# Patient Record
Sex: Female | Born: 1970 | Race: White | Hispanic: No | Marital: Single | Smoking: Current every day smoker
Health system: Southern US, Community
[De-identification: ages and names within clinical notes are randomized; demographics above are authoritative.]

## PROBLEM LIST (undated history)

## (undated) DIAGNOSIS — R519 Headache, unspecified: Secondary | ICD-10-CM

## (undated) DIAGNOSIS — K449 Diaphragmatic hernia without obstruction or gangrene: Secondary | ICD-10-CM

## (undated) DIAGNOSIS — C801 Malignant (primary) neoplasm, unspecified: Secondary | ICD-10-CM

## (undated) DIAGNOSIS — K219 Gastro-esophageal reflux disease without esophagitis: Secondary | ICD-10-CM

## (undated) DIAGNOSIS — F32A Depression, unspecified: Secondary | ICD-10-CM

## (undated) DIAGNOSIS — K589 Irritable bowel syndrome without diarrhea: Secondary | ICD-10-CM

## (undated) HISTORY — PX: ABDOMINAL HYSTERECTOMY: SHX81

## (undated) HISTORY — PX: TONSILLECTOMY: SUR1361

## (undated) HISTORY — PX: CHOLECYSTECTOMY: SHX55

## (undated) HISTORY — PX: BREAST BIOPSY: SHX20

---

## 2006-10-29 ENCOUNTER — Emergency Department: Payer: Self-pay | Admitting: Emergency Medicine

## 2006-10-29 ENCOUNTER — Other Ambulatory Visit: Payer: Self-pay

## 2006-12-28 ENCOUNTER — Ambulatory Visit: Payer: Self-pay | Admitting: Obstetrics and Gynecology

## 2007-01-11 ENCOUNTER — Ambulatory Visit: Payer: Self-pay | Admitting: Obstetrics and Gynecology

## 2007-03-15 ENCOUNTER — Ambulatory Visit: Payer: Self-pay

## 2007-04-02 ENCOUNTER — Ambulatory Visit: Payer: Self-pay | Admitting: Gastroenterology

## 2007-08-07 ENCOUNTER — Ambulatory Visit: Payer: Self-pay | Admitting: Family Medicine

## 2008-04-07 ENCOUNTER — Ambulatory Visit: Payer: Self-pay

## 2008-09-02 ENCOUNTER — Ambulatory Visit: Payer: Self-pay | Admitting: Nurse Practitioner

## 2008-12-01 ENCOUNTER — Ambulatory Visit: Payer: Self-pay | Admitting: Unknown Physician Specialty

## 2008-12-08 ENCOUNTER — Ambulatory Visit: Payer: Self-pay | Admitting: Unknown Physician Specialty

## 2009-01-08 ENCOUNTER — Ambulatory Visit: Payer: Self-pay | Admitting: Unknown Physician Specialty

## 2009-01-12 ENCOUNTER — Ambulatory Visit: Payer: Self-pay | Admitting: Unknown Physician Specialty

## 2009-03-26 ENCOUNTER — Emergency Department: Payer: Self-pay | Admitting: Emergency Medicine

## 2009-06-18 ENCOUNTER — Ambulatory Visit: Payer: Self-pay | Admitting: Gastroenterology

## 2010-04-06 ENCOUNTER — Ambulatory Visit: Payer: Self-pay | Admitting: Obstetrics and Gynecology

## 2010-04-11 ENCOUNTER — Ambulatory Visit: Payer: Self-pay | Admitting: Obstetrics and Gynecology

## 2010-04-13 LAB — PATHOLOGY REPORT

## 2010-06-07 ENCOUNTER — Emergency Department: Payer: Self-pay | Admitting: Emergency Medicine

## 2011-07-19 ENCOUNTER — Ambulatory Visit: Payer: Self-pay | Admitting: Family Medicine

## 2011-10-16 ENCOUNTER — Emergency Department: Payer: Self-pay | Admitting: Emergency Medicine

## 2013-02-14 ENCOUNTER — Ambulatory Visit: Payer: Self-pay | Admitting: Physician Assistant

## 2013-03-19 ENCOUNTER — Ambulatory Visit: Payer: Self-pay | Admitting: Family Medicine

## 2013-03-19 LAB — RAPID INFLUENZA A&B ANTIGENS

## 2013-06-21 ENCOUNTER — Ambulatory Visit: Payer: Self-pay | Admitting: Family Medicine

## 2013-06-21 LAB — BASIC METABOLIC PANEL
ANION GAP: 10 (ref 7–16)
BUN: 11 mg/dL (ref 7–18)
CALCIUM: 8.7 mg/dL (ref 8.5–10.1)
CHLORIDE: 104 mmol/L (ref 98–107)
CREATININE: 0.77 mg/dL (ref 0.60–1.30)
Co2: 24 mmol/L (ref 21–32)
EGFR (African American): >60
Glucose: 94 mg/dL (ref 65–99)
Osmolality: 275 (ref 275–301)
POTASSIUM: 4 mmol/L (ref 3.5–5.1)
Sodium: 138 mmol/L (ref 136–145)

## 2013-06-21 LAB — CBC WITH DIFFERENTIAL/PLATELET
Basophil #: 0.1 10*3/uL (ref 0.0–0.1)
Basophil %: 0.8 %
Eosinophil #: 0.2 10*3/uL (ref 0.0–0.7)
Eosinophil %: 2.6 %
HCT: 45 % (ref 35.0–47.0)
HGB: 14.8 g/dL (ref 12.0–16.0)
LYMPHS ABS: 2.3 10*3/uL (ref 1.0–3.6)
Lymphocyte %: 26.3 %
MCH: 30.3 pg (ref 26.0–34.0)
MCHC: 33 g/dL (ref 32.0–36.0)
MCV: 92 fL (ref 80–100)
Monocyte #: 0.4 x10 3/mm (ref 0.2–0.9)
Monocyte %: 4.7 %
Neutrophil #: 5.8 10*3/uL (ref 1.4–6.5)
Neutrophil %: 65.6 %
PLATELETS: 322 10*3/uL (ref 150–440)
RBC: 4.9 10*6/uL (ref 3.80–5.20)
RDW: 14.1 % (ref 11.5–14.5)
WBC: 8.9 10*3/uL (ref 3.6–11.0)

## 2013-06-21 LAB — URINALYSIS, COMPLETE
BACTERIA: NEGATIVE
Blood: NEGATIVE
Glucose,UR: NEGATIVE mg/dL (ref 0–75)
KETONE: NEGATIVE
Leukocyte Esterase: NEGATIVE
NITRITE: NEGATIVE
PH: 6 (ref 4.5–8.0)
PROTEIN: NEGATIVE
Specific Gravity: 1.02 (ref 1.003–1.030)

## 2013-06-21 LAB — PREGNANCY, URINE: PREGNANCY TEST, URINE: NEGATIVE m[IU]/mL

## 2013-08-24 ENCOUNTER — Ambulatory Visit: Payer: Self-pay | Admitting: Physician Assistant

## 2013-10-07 ENCOUNTER — Ambulatory Visit: Payer: Self-pay | Admitting: Gastroenterology

## 2013-10-09 LAB — PATHOLOGY REPORT

## 2013-12-19 ENCOUNTER — Ambulatory Visit: Payer: Self-pay

## 2014-01-20 ENCOUNTER — Ambulatory Visit: Payer: Self-pay | Admitting: Gastroenterology

## 2014-02-06 HISTORY — PX: BREAST CYST ASPIRATION: SHX578

## 2014-04-11 ENCOUNTER — Ambulatory Visit: Payer: Self-pay | Admitting: Physician Assistant

## 2014-04-22 ENCOUNTER — Ambulatory Visit: Payer: Self-pay | Admitting: Physician Assistant

## 2014-05-28 ENCOUNTER — Ambulatory Visit
Admit: 2014-05-28 | Disposition: A | Discharge status: Home / Self Care | Payer: Self-pay | Attending: Family Medicine | Admitting: Family Medicine

## 2014-05-28 LAB — URINALYSIS, COMPLETE
Bilirubin,UR: NEGATIVE
GLUCOSE, UR: NEGATIVE
Ketone: NEGATIVE
Leukocyte Esterase: NEGATIVE
NITRITE: NEGATIVE
PH: 5.5 (ref 5.0–8.0)
PROTEIN: NEGATIVE
SPECIFIC GRAVITY: 1.02 (ref 1.000–1.030)

## 2014-06-01 LAB — SURGICAL PATHOLOGY

## 2016-02-04 ENCOUNTER — Ambulatory Visit
Admission: EM | Admit: 2016-02-04 | Discharge: 2016-02-04 | Disposition: A | Discharge status: Home / Self Care | Payer: BLUE CROSS/BLUE SHIELD | Attending: Family Medicine | Admitting: Family Medicine

## 2016-02-04 DIAGNOSIS — J0100 Acute maxillary sinusitis, unspecified: Secondary | ICD-10-CM | POA: Diagnosis not present

## 2016-02-04 DIAGNOSIS — J069 Acute upper respiratory infection, unspecified: Secondary | ICD-10-CM

## 2016-02-04 MED ORDER — FLUTICASONE PROPIONATE 50 MCG/ACT NA SUSP: 2.0000 | Freq: Every day | NASAL | 0 refills | Status: AC

## 2016-02-04 MED ORDER — FEXOFENADINE-PSEUDOEPHED ER 180-240 MG PO TB24: 1.0000 | ORAL_TABLET | Freq: Every day | ORAL | 0 refills | Status: DC

## 2016-02-04 MED ORDER — CEFUROXIME AXETIL 500 MG PO TABS: 500.0000 mg | ORAL_TABLET | Freq: Two times a day (BID) | ORAL | 0 refills | Status: DC

## 2016-02-04 MED ORDER — PREDNISONE 10 MG (21) PO TBPK: ORAL_TABLET | ORAL | 0 refills | Status: DC

## 2016-02-04 NOTE — ED Provider Notes (Signed)
[facility]    CSN: [id number] Arrival date & time: 02/04/16  1352     History   Chief Complaint Chief Complaint  Patient presents with  . Recurrent Sinusitis    HPI Patricia Hoffman is a 45 y.o. female.   She is here because of nasal congestion cough she states started before Thanksgiving. She was seen at one urgent care in [place] and placed on amoxicillin. She states didn't seem to knock it out and the symptoms remain and have gotten worse now. She states that when she normally has a sinus infection they use Augmentin and amoxicillin seem to have the kick to knock it out. She's had to use nasal steroid nasal spray and oral prednisone before in the past. Unfortunately she still smokes. She's had recurrent sinus infections past surgeries abdominal hysterectomy cholecystectomy and tonsillectomy. No known drug allergies. No chronic medical problems and no pertinent family medical history relevant to today's visit.   The history is provided by the patient. No language interpreter was used.  URI  Presenting symptoms: congestion, cough, facial pain and rhinorrhea   Presenting symptoms: no fever   Severity:  Moderate Duration:  6 weeks Timing:  Constant Progression:  Worsening Chronicity:  Recurrent Relieved by:  Nothing Ineffective treatments:  OTC medications and prescription medications Associated symptoms: sinus pain and swollen glands   Associated symptoms: no wheezing   Risk factors: no chronic cardiac disease, no chronic kidney disease, no diabetes mellitus, no recent illness, no recent travel and no sick contacts     History reviewed. No pertinent past medical history.  There are no active problems to display for this patient.   Past Surgical History:  Procedure Laterality Date  . ABDOMINAL HYSTERECTOMY    . CHOLECYSTECTOMY    . TONSILLECTOMY      OB History    No data available       Home Medications    Prior to Admission medications     Medication Sig Start Date End Date Taking? Authorizing Provider  buPROPion (WELLBUTRIN) 75 MG tablet Take 75 mg by mouth 2 (two) times daily.   Yes Historical Provider, MD  sucralfate (CARAFATE) 1 g tablet Take 1 g by mouth 4 (four) times daily -  with meals and at bedtime.   Yes Historical Provider, MD  cefUROXime (CEFTIN) 500 MG tablet Take 1 tablet (500 mg total) by mouth 2 (two) times daily. 02/04/16   Frederich Cha, MD  fexofenadine-pseudoephedrine (ALLEGRA-D ALLERGY & CONGESTION) 180-240 MG 24 hr tablet Take 1 tablet by mouth daily. 02/04/16   Frederich Cha, MD  fluticasone (FLONASE) 50 MCG/ACT nasal spray Place 2 sprays into both nostrils daily. 02/04/16   Frederich Cha, MD  predniSONE (STERAPRED UNI-PAK 21 TAB) 10 MG (21) TBPK tablet Sig 6 tablet day 1, 5 tablets day 2, 4 tablets day 3,,3tablets day 4, 2 tablets day 5, 1 tablet day 6 take all tablets orally 02/04/16   Frederich Cha, MD    Family History History reviewed. No pertinent family history.  Social History Social History  Substance Use Topics  . Smoking status: Never Smoker  . Smokeless tobacco: Never Used  . Alcohol use No     Allergies   Patient has no known allergies.   Review of Systems Review of Systems  Constitutional: Negative for activity change and fever.  HENT: Positive for congestion, facial swelling, rhinorrhea, sinus pain and sinus pressure.   Respiratory: Positive for cough. Negative for wheezing.   All other  systems reviewed and are negative.    Physical Exam Triage Vital Signs ED Triage Vitals  Enc Vitals Group     BP 02/04/16 1448 121/75     Pulse Rate 02/04/16 1448 66     Resp 02/04/16 1448 18     Temp 02/04/16 1448 97.8 F (36.6 C)     Temp Source 02/04/16 1448 Oral     SpO2 02/04/16 1448 100 %     Weight 02/04/16 1448 140 lb (63.5 kg)     Height 02/04/16 1448 5\' 6"  (1.676 m)     Head Circumference --      Peak Flow --      Pain Score 02/04/16 1450 5     Pain Loc --      Pain Edu? --       Excl. in [place]? --    No data found.   Updated Vital Signs BP 121/75 (BP Location: Left Arm)   Pulse 66   Temp 97.8 F (36.6 C) (Oral)   Resp 18   Ht 5\' 6"  (1.676 m)   Wt 140 lb (63.5 kg)   SpO2 100%   BMI 22.60 kg/m   Visual Acuity Right Eye Distance:   Left Eye Distance:   Bilateral Distance:    Right Eye Near:   Left Eye Near:    Bilateral Near:     Physical Exam  Constitutional: She is oriented to person, place, and time. She appears well-developed and well-nourished.  HENT:  Head: Normocephalic and atraumatic.  Right Ear: External ear normal.  Left Ear: External ear normal.  Eyes: EOM are normal. Pupils are equal, round, and reactive to light.  Neck: Normal range of motion. Neck supple. No tracheal deviation present. No thyromegaly present.  Cardiovascular: Normal rate and regular rhythm.   Pulmonary/Chest: Effort normal and breath sounds normal.  Musculoskeletal: Normal range of motion.  Lymphadenopathy:    She has cervical adenopathy.  Neurological: She is alert and oriented to person, place, and time.  Skin: Skin is warm.  Psychiatric: She has a normal mood and affect.  Vitals reviewed.    UC Treatments / Results  Labs (all labs ordered are listed, but only abnormal results are displayed) Labs Reviewed - No data to display  EKG  EKG Interpretation None       Radiology No results found.  Procedures Procedures (including critical care time)  Medications Ordered in UC Medications - No data to display   Initial Impression / Assessment and Plan / UC Course  I have reviewed the triage vital signs and the nursing notes.  Pertinent labs & imaging results that were available during my care of the patient were reviewed by me and considered in my medical decision making (see chart for details).  Clinical Course    Because of the exposure to amoxicillin earlier in the persistence of her systems place on Ceftin 500 mg 1 tablet twice a day  Flonase nasal spray as directed 2 puffs each nostril and Allegra-D 24 hours 1 tablet daily. Will add also 6 day course of prednisone taper. Strongly suggested she stop smoking pot with PCP in 1-2 weeks not better she declined work note at this time.   Final Clinical Impressions(s) / UC Diagnoses   Final diagnoses:  Acute maxillary sinusitis, recurrence not specified  Upper respiratory tract infection, unspecified type    New Prescriptions New Prescriptions   CEFUROXIME (CEFTIN) 500 MG TABLET    Take 1 tablet (500 mg total)  by mouth 2 (two) times daily.   FEXOFENADINE-PSEUDOEPHEDRINE (ALLEGRA-D ALLERGY & CONGESTION) 180-240 MG 24 HR TABLET    Take 1 tablet by mouth daily.   FLUTICASONE (FLONASE) 50 MCG/ACT NASAL SPRAY    Place 2 sprays into both nostrils daily.   PREDNISONE (STERAPRED UNI-PAK 21 TAB) 10 MG (21) TBPK TABLET    Sig 6 tablet day 1, 5 tablets day 2, 4 tablets day 3,,3tablets day 4, 2 tablets day 5, 1 tablet day 6 take all tablets orally     Note: This dictation was prepared with Dragon dictation along with smaller phrase technology. Any transcriptional errors that result from this process are unintentional.   Frederich Cha, MD 02/04/16 1606

## 2016-02-04 NOTE — ED Triage Notes (Signed)
Pt c/o of sinus infection for a over month. Facial pressure, headache, congestion and productive cough.

## 2016-03-29 ENCOUNTER — Other Ambulatory Visit: Payer: Self-pay | Admitting: Family Medicine

## 2016-03-29 DIAGNOSIS — Z1231 Encounter for screening mammogram for malignant neoplasm of breast: Secondary | ICD-10-CM

## 2016-04-12 ENCOUNTER — Ambulatory Visit
Admission: RE | Admit: 2016-04-12 | Discharge: 2016-04-12 | Disposition: A | Discharge status: Home / Self Care | Payer: BLUE CROSS/BLUE SHIELD | Source: Ambulatory Visit | Attending: Family Medicine | Admitting: Family Medicine

## 2016-04-12 DIAGNOSIS — Z1231 Encounter for screening mammogram for malignant neoplasm of breast: Secondary | ICD-10-CM | POA: Insufficient documentation

## 2016-04-18 ENCOUNTER — Other Ambulatory Visit: Payer: Self-pay | Admitting: *Deleted

## 2016-04-18 ENCOUNTER — Inpatient Hospital Stay
Admission: RE | Admit: 2016-04-18 | Discharge: 2016-04-18 | Disposition: A | Discharge status: Home / Self Care | Payer: Self-pay | Source: Ambulatory Visit | Attending: *Deleted | Admitting: *Deleted

## 2016-04-18 DIAGNOSIS — Z9289 Personal history of other medical treatment: Secondary | ICD-10-CM

## 2016-04-21 ENCOUNTER — Ambulatory Visit (INDEPENDENT_AMBULATORY_CARE_PROVIDER_SITE_OTHER): Payer: BLUE CROSS/BLUE SHIELD

## 2016-04-21 ENCOUNTER — Encounter: Payer: Self-pay | Admitting: Emergency Medicine

## 2016-04-21 ENCOUNTER — Ambulatory Visit
Admission: EM | Admit: 2016-04-21 | Discharge: 2016-04-21 | Disposition: A | Discharge status: Home / Self Care | Payer: BLUE CROSS/BLUE SHIELD | Attending: Family Medicine | Admitting: Family Medicine

## 2016-04-21 DIAGNOSIS — M25532 Pain in left wrist: Secondary | ICD-10-CM

## 2016-04-21 MED ORDER — NAPROXEN 500 MG PO TABS: 500.0000 mg | ORAL_TABLET | Freq: Two times a day (BID) | ORAL | 0 refills | Status: DC

## 2016-04-21 NOTE — ED Provider Notes (Signed)
CSN: [id number]     Arrival date & time 04/21/16  5102 History   First MD Initiated Contact with Patient 04/21/16 [phone]     Chief Complaint  Patient presents with  . 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 She doesn't not remember any specific injury. 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 Currently she is most tender over the distal ulna and ulnar styloid as well as the pisiform  No past medical history on file. Past Surgical History:  Procedure Laterality Date  . ABDOMINAL HYSTERECTOMY    . BREAST CYST ASPIRATION Left 2016   left fna-neg  . CHOLECYSTECTOMY    . TONSILLECTOMY     Family History  Problem Relation Age of Onset  . Breast cancer Neg Hx    Social History  Substance Use Topics  . Smoking status: Current Every Day Smoker    Packs/day: 0.50    Types: Cigarettes  . Smokeless tobacco: Never Used  . Alcohol use No   OB History    No data available     Review of Systems  Constitutional: Positive for activity change. Negative for chills, fatigue and fever.  Musculoskeletal: Positive for arthralgias, joint swelling and myalgias.  All other systems reviewed and are negative.   Allergies  Patient has no known allergies.  Home Medications   Prior to Admission medications   Medication Sig Start Date End Date Taking? Authorizing Provider  buPROPion (WELLBUTRIN) 75 MG tablet Take 75 mg by mouth 2 (two) times daily.    Historical Provider, MD  cefUROXime (CEFTIN) 500 MG tablet Take 1 tablet (500 mg total) by mouth 2 (two) times daily. 02/04/16   Frederich Cha, MD  fexofenadine-pseudoephedrine (ALLEGRA-D ALLERGY & CONGESTION) 180-240 MG 24 hr tablet Take 1 tablet by mouth daily. 02/04/16   Frederich Cha, MD  fluticasone (FLONASE) 50 MCG/ACT nasal spray Place 2 sprays into both nostrils daily. 02/04/16   Frederich Cha, MD  naproxen (NAPROSYN) 500 MG tablet Take 1 tablet (500 mg total) by mouth 2 (two) times daily with a meal. 04/21/16   Lorin Picket, PA-C  predniSONE (STERAPRED UNI-PAK 21 TAB) 10 MG (21) TBPK tablet Sig 6 tablet day 1, 5 tablets day 2, 4 tablets day 3,,3tablets day 4, 2 tablets day 5, 1 tablet day 6 take all tablets orally 02/04/16   Frederich Cha, MD  sucralfate (CARAFATE) 1 g tablet Take 1 g by mouth 4 (four) times daily -  with meals and at bedtime.    Historical Provider, MD   Meds Ordered and Administered this Visit  Medications - No data to display  BP 124/80 (BP Location: Left Arm)   Pulse 65   Temp 97.7 F (36.5 C) (Oral)   Resp 16   Ht 5\' 5"  (1.651 m)   Wt 155 lb (70.3 kg)   SpO2 100%   BMI 25.79 kg/m  No data found.   Physical Exam  Constitutional: She is oriented to person, place, and time. She appears well-developed and well-nourished. No distress.  HENT:  Head: Normocephalic and atraumatic.  Eyes: Pupils are equal, round, and reactive to light.  Neck: Normal range of motion.  Musculoskeletal: She exhibits edema and tenderness.  Examination of the left nondominant wrist shows pronation  supination slightly limited because of discomfort over the ulna. Extension is limited to approximately 45 flexion to 60. Pronation lacks approximately 10 and supination the same. Rectal tenderness is over the distal ulna particularly the styloid area and also in the possible form. Assisted ulnar deviation is the most painful motion. Incision is intact distally and pulses are intact both the ulnar and radial  Neurological: She is alert and oriented to person, place, and time.  Skin: Skin is dry. She is not diaphoretic.  Psychiatric: She has a normal mood and affect. Her behavior is normal. Judgment and thought content normal.  Nursing note and vitals  reviewed.   Urgent Care Course     Procedures (including critical care time)  Labs Review Labs Reviewed - No data to display  Imaging Review Dg Wrist Complete Left  Result Date: 04/21/2016 CLINICAL DATA:  Pain along medial wrist x2 days EXAM: LEFT WRIST - COMPLETE 3+ VIEW COMPARISON:  None. FINDINGS: No fracture or dislocation is seen. The joint spaces are preserved. Visualized soft tissues are within normal limits. IMPRESSION: Negative. Electronically Signed   By: Julian Hy M.D.   On: 04/21/2016 09:43     Visual Acuity Review  Right Eye Distance:   Left Eye Distance:   Bilateral Distance:    Right Eye Near:   Left Eye Near:    Bilateral Near:         MDM   1. Acute pain of left wrist    New Prescriptions   NAPROXEN (NAPROSYN) 500 MG TABLET    Take 1 tablet (500 mg total) by mouth 2 (two) times daily with a meal.  Plan: 1. Test/x-ray results and diagnosis reviewed with patient 2. rx as per orders; risks, benefits, potential side effects reviewed with patient 3. Recommend supportive treatment with Symptom avoidance and rest as necessary. Should avoid repetitive motion for the next to 2 weeks. If she is not improving she should follow-up with a orthopedic surgeon. 4. F/u prn if symptoms worsen or don't improve     Lorin Picket, PA-C 04/21/16 1006

## 2016-04-21 NOTE — ED Triage Notes (Signed)
Pt reports while working yesterday had sudden onset of left wrist pain. 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

## 2016-10-21 ENCOUNTER — Ambulatory Visit
Admission: EM | Admit: 2016-10-21 | Discharge: 2016-10-21 | Disposition: A | Discharge status: Home / Self Care | Payer: BLUE CROSS/BLUE SHIELD | Attending: Family Medicine | Admitting: Family Medicine

## 2016-10-21 DIAGNOSIS — R300 Dysuria: Secondary | ICD-10-CM

## 2016-10-21 DIAGNOSIS — S39012A Strain of muscle, fascia and tendon of lower back, initial encounter: Secondary | ICD-10-CM

## 2016-10-21 LAB — URINALYSIS, COMPLETE (UACMP) WITH MICROSCOPIC
Bilirubin Urine: NEGATIVE
Glucose, UA: NEGATIVE mg/dL
Ketones, ur: NEGATIVE mg/dL
Leukocytes, UA: NEGATIVE
Nitrite: NEGATIVE
PH: 6 (ref 5.0–8.0)
Protein, ur: NEGATIVE mg/dL
SPECIFIC GRAVITY, URINE: 1.01 (ref 1.005–1.030)

## 2016-10-21 MED ORDER — CYCLOBENZAPRINE HCL 10 MG PO TABS: 10.0000 mg | ORAL_TABLET | Freq: Two times a day (BID) | ORAL | 0 refills | Status: DC | PRN

## 2016-10-21 MED ORDER — CEPHALEXIN 500 MG PO CAPS: 500.0000 mg | ORAL_CAPSULE | Freq: Two times a day (BID) | ORAL | 0 refills | Status: DC

## 2016-10-21 MED ORDER — CEPHALEXIN 500 MG PO CAPS: 500.0000 mg | ORAL_CAPSULE | Freq: Two times a day (BID) | ORAL | 0 refills | Status: AC

## 2016-10-21 NOTE — Discharge Instructions (Signed)
Take medication as prescribed. Rest. Drink plenty of fluids.  ° °Follow up with your primary care physician this week as needed. Return to Urgent care for new or worsening concerns.  ° °

## 2016-10-21 NOTE — ED Provider Notes (Signed)
[facility] ____________________________________________  Time seen: Approximately 10:33 AM  I have reviewed the triage vital signs and the nursing notes.   HISTORY  Chief Complaint Back Pain (lower) and Urinary Frequency   HPI Patricia Hoffman is a 46 y.o. female  presenting for evaluation of 3 days of urinary frequency, urinary urgency and some low back pain. States no burning with urination, fevers or abdominal pain. Some suprapubic pressure. Patient states she is unsure if this is urinary or back related, states that she does have to do a lot of pushing of heavy objects at work. 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 States low back pain does improve with rest and is aggravated by twisting and bending. States occasionally she has some pain radiation down left leg, not constant. No paresthesias or decreased range of motion. Denies any fall, direct injury or direct trauma. Denies abrupt onset of back pain. Denies chronic back pain. Reports occasional UTIs in past, not recurrent. Denies known trigger. Reports continues to eat and drink well. Has not been taking anything over-the-counter for the same complaints. States that she has IBS and over-the-counter ibuprofen often flares up her IBS. Denies other aggravating or alleviating factors. Reports continues to eat and drink well. States last worked yesterday. Denies urinary incontinence or retention.  Denies chest pain, shortness of breath, abdominal pain, dysuria, extremity pain, extremity swelling or rash. Denies recent sickness. Denies recent antibiotic use.   No LMP recorded. Patient has had a hysterectomy.   medical history Depression Migraines  There are no active problems to display for this patient.   Past Surgical History:  Procedure Laterality Date  . ABDOMINAL HYSTERECTOMY    . BREAST CYST ASPIRATION Left 2016   left fna-neg  . CHOLECYSTECTOMY    . TONSILLECTOMY       No current  facility-administered medications for this encounter.   Current Outpatient Prescriptions:  .  amitriptyline (ELAVIL) 50 MG tablet, Take 50 mg by mouth at bedtime., Disp: , Rfl:  .  fexofenadine-pseudoephedrine (ALLEGRA-D ALLERGY & CONGESTION) 180-240 MG 24 hr tablet, Take 1 tablet by mouth daily., Disp: 30 tablet, Rfl: 0 .  FLUoxetine (PROZAC) 40 MG capsule, Take 40 mg by mouth daily., Disp: , Rfl:  .  fluticasone (FLONASE) 50 MCG/ACT nasal spray, Place 2 sprays into both nostrils daily., Disp: 16 g, Rfl: 0 .  buPROPion (WELLBUTRIN) 75 MG tablet, Take 75 mg by mouth 2 (two) times daily., Disp: , Rfl:  .  cefUROXime (CEFTIN) 500 MG tablet, Take 1 tablet (500 mg total) by mouth 2 (two) times daily., Disp: 20 tablet, Rfl: 0 .  cephALEXin (KEFLEX) 500 MG capsule, Take 1 capsule (500 mg total) by mouth 2 (two) times daily., Disp: 14 capsule, Rfl: 0 .  cyclobenzaprine (FLEXERIL) 10 MG tablet, Take 1 tablet (10 mg total) by mouth 2 (two) times daily as needed for muscle spasms. Do not drive while taking as can cause drowsiness, Disp: 15 tablet, Rfl: 0 .  naproxen (NAPROSYN) 500 MG tablet, Take 1 tablet (500 mg total) by mouth 2 (two) times daily with a meal., Disp: 60 tablet, Rfl: 0 .  predniSONE (STERAPRED UNI-PAK 21 TAB) 10 MG (21) TBPK tablet, Sig 6 tablet day 1, 5 tablets day 2, 4 tablets day 3,,3tablets day 4, 2 tablets day 5, 1 tablet day 6 take all tablets orally, Disp: 21 tablet, Rfl: 0 .  sucralfate (CARAFATE) 1 g tablet, Take 1 g by mouth 4 (four) times  daily -  with meals and at bedtime., Disp: , Rfl:   Allergies Patient has no known allergies.  Family History  Problem Relation Age of Onset  . Breast cancer Neg Hx     Social History Social History  Substance Use Topics  . Smoking status: Current Every Day Smoker    Packs/day: 1.00    Types: Cigarettes  . Smokeless tobacco: Never Used  . Alcohol use No    Review of Systems Constitutional: No fever/chills ENT: No sore  throat. Cardiovascular: Denies chest pain. Respiratory: Denies shortness of breath. Gastrointestinal: No abdominal pain.  No nausea, no vomiting.  No diarrhea.  No constipation. Genitourinary: Positive for dysuria. Musculoskeletal: Positive for back pain. Skin: Negative for rash.   ____________________________________________   PHYSICAL EXAM:  VITAL SIGNS: ED Triage Vitals  Enc Vitals Group     BP 10/21/16 0940 127/78     Pulse Rate 10/21/16 0940 73     Resp 10/21/16 0940 18     Temp 10/21/16 0940 98.1 F (36.7 C)     Temp Source 10/21/16 0940 Oral     SpO2 10/21/16 0940 100 %     Weight 10/21/16 0938 150 lb (68 kg)     Height 10/21/16 0938 5\' 6"  (1.676 m)     Head Circumference --      Peak Flow --      Pain Score 10/21/16 0938 6     Pain Loc --      Pain Edu? --      Excl. in [place]? --     Constitutional: Alert and oriented. Well appearing and in no acute distress. Cardiovascular: Normal rate, regular rhythm. Grossly normal heart sounds.  Good peripheral circulation. Respiratory: Normal respiratory effort without tachypnea nor retractions. Breath sounds are clear and equal bilaterally. No wheezes, rales, rhonchi. Gastrointestinal: Soft and nontender. No distention. Normal Bowel sounds. No CVA tenderness. Musculoskeletal: No midline cervical, thoracic or lumbar tenderness to palpation. Bilateral pedal pulses equal and easily palpated.      Right lower leg:  No tenderness or edema.      Left lower leg:  No tenderness or edema.  Except: Mild bilateral paralumbar tenderness to palpation, no point midline tenderness, no swelling or ecchymosis, pain increases with lumbar flexion and extension as well as lumbar right and left rotation, no pain with standing bilateral knee lifts, bilateral plantar flexion and dorsiflexion strong and equal, changes positions quickly and room a steady gait. Neurologic:  Normal speech and language. No gross focal neurologic deficits are appreciated.  Speech is normal. No gait instability.  Skin:  Skin is warm, dry and intact. No rash noted. Psychiatric: Mood and affect are normal. Speech and behavior are normal. Patient exhibits appropriate insight and judgment   ___________________________________________   LABS (all labs ordered are listed, but only abnormal results are displayed)  Labs Reviewed  URINALYSIS, COMPLETE (UACMP) WITH MICROSCOPIC - Abnormal; Notable for the following:       Result Value   Color, Urine STRAW (*)    Hgb urine dipstick TRACE (*)    Squamous Epithelial / LPF 0-5 (*)    Bacteria, UA FEW (*)    All other components within normal limits  URINE CULTURE     PROCEDURES Procedures     INITIAL IMPRESSION / ASSESSMENT AND PLAN / ED COURSE  Pertinent labs & imaging results that were available during my care of the patient were reviewed by me and considered in my medical decision making (  see chart for details).  Well appearing patient. No acute distress. Suspect lumbar strain as well as concern for urinary tract infection, will culture urine. Will start patient on oral Keflex. Patient does not tolerate NSAIDs well due to IBS per patient, will treat with when necessary Flexeril. Encourage rest, fluids and supportive care. Discussed follow-up as needed for continued back pain.Discussed indication, risks and benefits of medications with patient.  Discussed follow up with Primary care physician this week. Discussed follow up and return parameters including no resolution or any worsening concerns. Patient verbalized understanding and agreed to plan.   ____________________________________________   FINAL CLINICAL IMPRESSION(S) / ED DIAGNOSES  Final diagnoses:  Dysuria  Low back strain, initial encounter     Discharge Medication List as of 10/21/2016 10:19 AM      Note: This dictation was prepared with Dragon dictation along with smaller phrase technology. Any transcriptional errors that result from this  process are unintentional.         Marylene Land, NP 10/21/16 1107

## 2016-10-21 NOTE — ED Triage Notes (Signed)
Patient complains of low back pain and urinary frequency that started around 3 days ago. Patient states that she has noticed some urgency as well.

## 2016-10-22 LAB — URINE CULTURE

## 2016-11-01 ENCOUNTER — Encounter: Payer: Self-pay | Admitting: *Deleted

## 2016-11-01 ENCOUNTER — Ambulatory Visit
Admission: EM | Admit: 2016-11-01 | Discharge: 2016-11-01 | Disposition: A | Discharge status: Home / Self Care | Payer: BLUE CROSS/BLUE SHIELD | Attending: Family Medicine | Admitting: Family Medicine

## 2016-11-01 DIAGNOSIS — H9201 Otalgia, right ear: Secondary | ICD-10-CM | POA: Diagnosis not present

## 2016-11-01 DIAGNOSIS — J0100 Acute maxillary sinusitis, unspecified: Secondary | ICD-10-CM | POA: Diagnosis not present

## 2016-11-01 HISTORY — DX: Malignant (primary) neoplasm, unspecified: C80.1

## 2016-11-01 MED ORDER — AMOXICILLIN 875 MG PO TABS: 875.0000 mg | ORAL_TABLET | Freq: Two times a day (BID) | ORAL | 0 refills | Status: DC

## 2016-11-01 NOTE — ED Triage Notes (Signed)
Right ear pain, facial pain, head congestion, productive cough- green, x3-4 days.

## 2016-11-01 NOTE — ED Provider Notes (Signed)
[facility]    CSN: [id number] Arrival date & time: 11/01/16  3762     History   Chief Complaint Chief Complaint  Patient presents with  . Otalgia  . Cough  . Facial Pain    HPI Patricia Hoffman is a 46 y.o. female.   The history is provided by the patient.  Otalgia  Associated symptoms: congestion and cough   Associated symptoms: no headaches and no neck pain   Cough  Associated symptoms: ear pain   Associated symptoms: no headaches, no myalgias and no wheezing   URI  Presenting symptoms: congestion, cough, ear pain and facial pain   Severity:  Moderate Onset quality:  Sudden Duration:  6 days Timing:  Constant Progression:  Worsening Chronicity:  New Relieved by:  None tried Worsened by:  Nothing Ineffective treatments:  None tried Associated symptoms: sinus pain   Associated symptoms: no arthralgias, no headaches, no myalgias, no neck pain, no sneezing, no swollen glands and no wheezing   Risk factors: not elderly, no chronic cardiac disease, no chronic kidney disease, no chronic respiratory disease (none but chronic smoker), no diabetes mellitus, no immunosuppression, no recent illness, no recent travel and no sick contacts     Past Medical History:  Diagnosis Date  . Cancer ([place])     There are no active problems to display for this patient.   Past Surgical History:  Procedure Laterality Date  . ABDOMINAL HYSTERECTOMY    . BREAST CYST ASPIRATION Left 2016   left fna-neg  . CHOLECYSTECTOMY    . TONSILLECTOMY      OB History    No data available       Home Medications    Prior to Admission medications   Medication Sig Start Date End Date Taking? Authorizing Provider  amitriptyline (ELAVIL) 50 MG tablet Take 50 mg by mouth at bedtime.   Yes [provider]  fexofenadine-pseudoephedrine (ALLEGRA-D ALLERGY & CONGESTION) 180-240 MG 24 hr tablet Take 1 tablet by mouth daily. 02/04/16  Yes Frederich Cha, MD  FLUoxetine  (PROZAC) 40 MG capsule Take 40 mg by mouth daily.   Yes [provider]  fluticasone (FLONASE) 50 MCG/ACT nasal spray Place 2 sprays into both nostrils daily. 02/04/16  Yes Frederich Cha, MD  amoxicillin (AMOXIL) 875 MG tablet Take 1 tablet (875 mg total) by mouth 2 (two) times daily. 11/01/16   Norval Gable, MD  buPROPion (WELLBUTRIN) 75 MG tablet Take 75 mg by mouth 2 (two) times daily.    [provider]  cefUROXime (CEFTIN) 500 MG tablet Take 1 tablet (500 mg total) by mouth 2 (two) times daily. 02/04/16   Frederich Cha, MD  cyclobenzaprine (FLEXERIL) 10 MG tablet Take 1 tablet (10 mg total) by mouth 2 (two) times daily as needed for muscle spasms. Do not drive while taking as can cause drowsiness 10/21/16   Marylene Land, NP  naproxen (NAPROSYN) 500 MG tablet Take 1 tablet (500 mg total) by mouth 2 (two) times daily with a meal. 04/21/16   Lorin Picket, PA-C  predniSONE (STERAPRED UNI-PAK 21 TAB) 10 MG (21) TBPK tablet Sig 6 tablet day 1, 5 tablets day 2, 4 tablets day 3,,3tablets day 4, 2 tablets day 5, 1 tablet day 6 take all tablets orally 02/04/16   Frederich Cha, MD  sucralfate (CARAFATE) 1 g tablet Take 1 g by mouth 4 (four) times daily -  with meals and at bedtime.    [provider]  Family History Family History  Problem Relation Age of Onset  . Breast cancer Neg Hx     Social History Social History  Substance Use Topics  . Smoking status: Current Every Day Smoker    Packs/day: 1.00    Types: Cigarettes  . Smokeless tobacco: Never Used  . Alcohol use No     Allergies   Patient has no known allergies.   Review of Systems Review of Systems  HENT: Positive for congestion, ear pain and sinus pain. Negative for sneezing.   Respiratory: Positive for cough. Negative for wheezing.   Musculoskeletal: Negative for arthralgias, myalgias and neck pain.  Neurological: Negative for headaches.     Physical Exam Triage Vital Signs ED Triage  Vitals  Enc Vitals Group     BP 11/01/16 0824 113/78     Pulse Rate 11/01/16 0824 85     Resp 11/01/16 0824 16     Temp 11/01/16 0824 97.8 F (36.6 C)     Temp Source 11/01/16 0824 Oral     SpO2 11/01/16 0824 100 %     Weight 11/01/16 0826 150 lb (68 kg)     Height 11/01/16 0826 5\' 6"  (1.676 m)     Head Circumference --      Peak Flow --      Pain Score 11/01/16 0851 6     Pain Loc --      Pain Edu? --      Excl. in [place]? --    No data found.   Updated Vital Signs BP 113/78 (BP Location: Left Arm)   Pulse 85   Temp 97.8 F (36.6 C) (Oral)   Resp 16   Ht 5\' 6"  (1.676 m)   Wt 150 lb (68 kg)   SpO2 100%   BMI 24.21 kg/m   Visual Acuity Right Eye Distance:   Left Eye Distance:   Bilateral Distance:    Right Eye Near:   Left Eye Near:    Bilateral Near:     Physical Exam  Constitutional: She appears well-developed and well-nourished. No distress.  HENT:  Head: Normocephalic and atraumatic.  Right Ear: Tympanic membrane, external ear and ear canal normal.  Left Ear: Tympanic membrane, external ear and ear canal normal.  Nose: Mucosal edema and rhinorrhea present. No nose lacerations, sinus tenderness, nasal deformity, septal deviation or nasal septal hematoma. No epistaxis.  No foreign bodies. Right sinus exhibits maxillary sinus tenderness and frontal sinus tenderness. Left sinus exhibits maxillary sinus tenderness and frontal sinus tenderness.  Mouth/Throat: Uvula is midline, oropharynx is clear and moist and mucous membranes are normal. No oropharyngeal exudate.  Eyes: Pupils are equal, round, and reactive to light. Conjunctivae and EOM are normal. Right eye exhibits no discharge. Left eye exhibits no discharge. No scleral icterus.  Neck: Normal range of motion. Neck supple. No thyromegaly present.  Cardiovascular: Normal rate, regular rhythm and normal heart sounds.   Pulmonary/Chest: Effort normal and breath sounds normal. No respiratory distress. She has no wheezes.  She has no rales.  Lymphadenopathy:    She has no cervical adenopathy.  Skin: She is not diaphoretic.  Nursing note and vitals reviewed.    UC Treatments / Results  Labs (all labs ordered are listed, but only abnormal results are displayed) Labs Reviewed - No data to display  EKG  EKG Interpretation None       Radiology No results found.  Procedures Procedures (including critical care time)  Medications Ordered in UC Medications -  No data to display   Initial Impression / Assessment and Plan / UC Course  I have reviewed the triage vital signs and the nursing notes.  Pertinent labs & imaging results that were available during my care of the patient were reviewed by me and considered in my medical decision making (see chart for details).      Final Clinical Impressions(s) / UC Diagnoses   Final diagnoses:  Acute maxillary sinusitis, recurrence not specified    New Prescriptions Discharge Medication List as of 11/01/2016  8:48 AM    START taking these medications   Details  amoxicillin (AMOXIL) 875 MG tablet Take 1 tablet (875 mg total) by mouth 2 (two) times daily., Starting Wed 11/01/2016, Normal       1.diagnosis reviewed with patient 2. rx as per orders above; reviewed possible side effects, interactions, risks and benefits  3. Recommend supportive treatment with otc flonase, decongestant 4. Follow-up prn if symptoms worsen or don't improve Controlled Substance Prescriptions [place] Controlled Substance Registry consulted? Not Applicable   Norval Gable, MD 11/01/16 [phone]

## 2017-02-19 ENCOUNTER — Ambulatory Visit
Admission: EM | Admit: 2017-02-19 | Discharge: 2017-02-19 | Disposition: A | Discharge status: Home / Self Care | Payer: BLUE CROSS/BLUE SHIELD | Attending: Family Medicine | Admitting: Family Medicine

## 2017-02-19 ENCOUNTER — Other Ambulatory Visit: Payer: Self-pay

## 2017-02-19 DIAGNOSIS — M65842 Other synovitis and tenosynovitis, left hand: Secondary | ICD-10-CM

## 2017-02-19 DIAGNOSIS — M25532 Pain in left wrist: Secondary | ICD-10-CM

## 2017-02-19 DIAGNOSIS — M778 Other enthesopathies, not elsewhere classified: Secondary | ICD-10-CM

## 2017-02-19 MED ORDER — MELOXICAM 15 MG PO TABS: 15.0000 mg | ORAL_TABLET | Freq: Every day | ORAL | 0 refills | Status: DC | PRN

## 2017-02-19 NOTE — Discharge Instructions (Signed)
Take medication as prescribed. Rest. Drink plenty of fluids. Ice. Use splint as discussed.   Follow up with your primary care physician or the above, this week as needed. Return to Urgent care for new or worsening concerns.

## 2017-02-19 NOTE — ED Triage Notes (Signed)
Patient complains of left wrist pain that started 2 weeks ago with no known injury. Patient states that pain is sharp and constant. Patient has noticed swelling in her wrist.

## 2017-02-19 NOTE — ED Provider Notes (Signed)
[facility] ____________________________________________  Time seen: Approximately 1:36 PM  I have reviewed the triage vital signs and the nursing notes.   HISTORY  Chief Complaint Wrist Pain (left)  HPI Patricia Hoffman is a 47 y.o. female presenting for evaluation of left wrist pain that is been present for the last 2 weeks and continues to gradually worsen.  States pain is to the distal radial aspect of left wrist.  States right-hand dominant.  Denies fall, injury or direct trauma.  Denies any skin changes, redness or bruising.  Denies paresthesias or other complaints.States occasional pain shoots from left wrist to elbow and to hand, but not further.  Patient states that she feels like there is some swelling to her left medial wrist.  Reports that she does a lot of repetitive movements throughout the day.  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  States has taken occasional ibuprofen and Tylenol over-the-counter without much change in pain.  States pain is worse with direct palpation as well as movement.  States that she has been trying to rest the area.  Reports she has had a history of similar happening approximately 1 year ago and had an x-ray performed that was negative.  Denies cardiac history.  Denies renal insufficiency.Denies chest pain, shortness of breath, abdominal pain, or rash. Denies recent sickness. Denies recent antibiotic use.   No LMP recorded. Patient has had a hysterectomy.  [place]: PCP   Past Medical History:  Diagnosis Date  . Cancer ([place])     There are no active problems to display for this patient.   Past Surgical History:  Procedure Laterality Date  . ABDOMINAL HYSTERECTOMY    . BREAST CYST ASPIRATION Left 2016   left fna-neg  . CHOLECYSTECTOMY    . TONSILLECTOMY       No current facility-administered medications for this encounter.   Current Outpatient Medications:   .  fexofenadine-pseudoephedrine (ALLEGRA-D ALLERGY & CONGESTION) 180-240 MG 24 hr tablet, Take 1 tablet by mouth daily., Disp: 30 tablet, Rfl: 0 .  FLUoxetine (PROZAC) 40 MG capsule, Take 40 mg by mouth daily., Disp: , Rfl:  .  fluticasone (FLONASE) 50 MCG/ACT nasal spray, Place 2 sprays into both nostrils daily., Disp: 16 g, Rfl: 0 .  amitriptyline (ELAVIL) 50 MG tablet, Take 50 mg by mouth at bedtime., Disp: , Rfl:  .  meloxicam (MOBIC) 15 MG tablet, Take 1 tablet (15 mg total) by mouth daily as needed., Disp: 10 tablet, Rfl: 0  Allergies Patient has no known allergies.  Family History  Problem Relation Age of Onset  . Heart disease Father   . Breast cancer Neg Hx     Social History Social History   Tobacco Use  . Smoking status: Current Every Day Smoker    Packs/day: 0.50    Types: Cigarettes  . Smokeless tobacco: Never Used  Substance Use Topics  . Alcohol use: No  . Drug use: No    Review of Systems Constitutional: No fever/chills Cardiovascular: Denies chest pain. Respiratory: Denies shortness of breath. Gastrointestinal: No abdominal pain.   Genitourinary: Negative for dysuria. Musculoskeletal: AS above.  Skin: no rash  ____________________________________________   PHYSICAL EXAM:  VITAL SIGNS: ED Triage Vitals  Enc Vitals Group     BP 02/19/17 1244 109/70     Pulse Rate 02/19/17 1244 86     Resp 02/19/17 1244 18     Temp 02/19/17 1244 98.2  F (36.8 C)     Temp Source 02/19/17 1244 Oral     SpO2 02/19/17 1244 99 %     Weight 02/19/17 1242 150 lb (68 kg)     Height 02/19/17 1242 5\' 6"  (1.676 m)     Head Circumference --      Peak Flow --      Pain Score 02/19/17 1242 6     Pain Loc --      Pain Edu? --      Excl. in [place]? --     Constitutional: Alert and oriented. Well appearing and in no acute distress. Cardiovascular: Normal rate, regular rhythm. Grossly normal heart sounds.  Good peripheral circulation. Respiratory: Normal respiratory effort  without tachypnea nor retractions. Breath sounds are clear and equal bilaterally. No wheezes, rales, rhonchi. Musculoskeletal:  Steady gait. Bilateral distal radial pulses equal and easily palpated.  Except: Left medial wrist radial aspect mild diffuse tenderness to palpation more over wrist flexor tendon, no point bony tenderness, no snuffbox tenderness, no ecchymosis, no erythema, mild pain with resisted thumb flexion no pain with resisted thumb extension, no motor or tendon deficits, normal distal sensation and capillary refill, left upper extremity otherwise nontender, mild pain with resisted wrist flexion and extension and rotation slightly limited. Neurologic:  Normal speech and language. Speech is normal. No gait instability.  Skin:  Skin is warm, dry and intact. No rash noted. Psychiatric: Mood and affect are normal. Speech and behavior are normal. Patient exhibits appropriate insight and judgment   ___________________________________________   LABS (all labs ordered are listed, but only abnormal results are displayed)  Labs Reviewed - No data to display  RADIOLOGY  No results found. ____________________________________________   PROCEDURES Procedures   INITIAL IMPRESSION / ASSESSMENT AND PLAN / ED COURSE  Pertinent labs & imaging results that were available during my care of the patient were reviewed by me and considered in my medical decision making (see chart for details).  Well-appearing patient.  No acute distress.  History of similar in the past.  Suspect repetitive use injury from work.  No point bony tenderness.  No appearance of cellulitis.  Has no point bony tenderness and no trauma, will defer x-ray at this time, patient agrees.  Will treat patient with Velcro cockup splint and daily Mobic.  Discussed note for work for today and tomorrow, patient states that she wants to go to work, but requests restriction note.  Restriction note given for no lifting greater than 10  pounds with left wrist for 2 days only.  Encouraged to continue to follow-up with orthopedic.  Rest, ice, elevation and cockup splint for 1 week, stretching. Discussed indication, risks and benefits of medications with patient.  Discussed follow up with Primary care physician this week. Discussed follow up and return parameters including no resolution or any worsening concerns. Patient verbalized understanding and agreed to plan.   ____________________________________________   FINAL CLINICAL IMPRESSION(S) / ED DIAGNOSES  Final diagnoses:  Left wrist pain  Left wrist tendinitis     ED Discharge Orders        Ordered    meloxicam (MOBIC) 15 MG tablet  Daily PRN     02/19/17 1345       Note: This dictation was prepared with Dragon dictation along with smaller phrase technology. Any transcriptional errors that result from this process are unintentional.         Marylene Land, NP 02/19/17 2122

## 2017-02-22 ENCOUNTER — Telehealth: Payer: Self-pay | Admitting: Emergency Medicine

## 2017-02-22 NOTE — Telephone Encounter (Signed)
Called to follow up with patient after her recent visit. Home # changed, disconnected or no longer in service. Cell # mail box was full and unable to leave a message.

## 2017-10-17 ENCOUNTER — Other Ambulatory Visit: Payer: Self-pay

## 2017-10-17 ENCOUNTER — Encounter: Payer: Self-pay | Admitting: Emergency Medicine

## 2017-10-17 ENCOUNTER — Ambulatory Visit
Admission: EM | Admit: 2017-10-17 | Discharge: 2017-10-17 | Disposition: A | Discharge status: Home / Self Care | Payer: BLUE CROSS/BLUE SHIELD | Attending: Family Medicine | Admitting: Family Medicine

## 2017-10-17 DIAGNOSIS — R059 Cough, unspecified: Secondary | ICD-10-CM

## 2017-10-17 DIAGNOSIS — R05 Cough: Secondary | ICD-10-CM

## 2017-10-17 DIAGNOSIS — R062 Wheezing: Secondary | ICD-10-CM | POA: Diagnosis not present

## 2017-10-17 MED ORDER — ALBUTEROL SULFATE HFA 108 (90 BASE) MCG/ACT IN AERS: 2.0000 | INHALATION_SPRAY | RESPIRATORY_TRACT | 0 refills | Status: AC | PRN

## 2017-10-17 MED ORDER — DOXYCYCLINE HYCLATE 100 MG PO CAPS: 100.0000 mg | ORAL_CAPSULE | Freq: Two times a day (BID) | ORAL | 0 refills | Status: DC

## 2017-10-17 MED ORDER — PREDNISONE 10 MG PO TABS: ORAL_TABLET | ORAL | 0 refills | Status: DC

## 2017-10-17 NOTE — ED Provider Notes (Signed)
[facility] ____________________________________________  Time seen: Approximately 11:41 AM  I have reviewed the triage vital signs and the nursing notes.   HISTORY  Chief Complaint Cough   HPI Patricia Hoffman is a 47 y.o. female presenting for evaluation of 5 days of runny nose, nasal congestion, cough, chills and feeling rundown.  States sore throat is since resolved.  States continues with nasal congestion but her biggest complaint is cough and chest congestion sensation.  States cough is productive of greenish phlegm.  States her daughter recently sick with similar.  Longtime smoker, denies known history of COPD, but reports recurrent bronchitis history as well as she normally has albuterol inhalers but currently out of them.  States that she has been wheezing with current sickness intermittently.  Denies current chest pain or shortness of breath.  No known fevers.  Has been taken multiple over-the-counter cough and decongestant agents without resolution.  Denies other aggravating alleviating factors.  Reports otherwise doing well.  Denies chest pain, shortness of breath, abdominal pain, or rash. Denies recent sickness. Denies recent antibiotic use.   [place]: PCP   Past Medical History:  Diagnosis Date  . Cancer ([place])     There are no active problems to display for this patient.   Past Surgical History:  Procedure Laterality Date  . ABDOMINAL HYSTERECTOMY    . BREAST CYST ASPIRATION Left 2016   left fna-neg  . CHOLECYSTECTOMY    . TONSILLECTOMY       No current facility-administered medications for this encounter.   Current Outpatient Medications:  .  amitriptyline (ELAVIL) 50 MG tablet, Take 50 mg by mouth at bedtime., Disp: , Rfl:  .  FLUoxetine (PROZAC) 40 MG capsule, Take 40 mg by mouth daily., Disp: , Rfl:  .  fluticasone (FLONASE) 50 MCG/ACT nasal spray, Place 2 sprays into both nostrils daily., Disp: 16 g, Rfl: 0 .  meloxicam  (MOBIC) 15 MG tablet, Take 1 tablet (15 mg total) by mouth daily as needed., Disp: 10 tablet, Rfl: 0 .  albuterol (PROVENTIL HFA;VENTOLIN HFA) 108 (90 Base) MCG/ACT inhaler, Inhale 2 puffs into the lungs every 4 (four) hours as needed for wheezing., Disp: 1 Inhaler, Rfl: 0 .  doxycycline (VIBRAMYCIN) 100 MG capsule, Take 1 capsule (100 mg total) by mouth 2 (two) times daily., Disp: 20 capsule, Rfl: 0 .  fexofenadine-pseudoephedrine (ALLEGRA-D ALLERGY & CONGESTION) 180-240 MG 24 hr tablet, Take 1 tablet by mouth daily., Disp: 30 tablet, Rfl: 0 .  predniSONE (DELTASONE) 10 MG tablet, Start 60 mg po day one, then 50 mg po day two, taper by 10 mg daily until complete., Disp: 21 tablet, Rfl: 0  Allergies Patient has no known allergies.  Family History  Problem Relation Age of Onset  . Heart disease Father   . Breast cancer Neg Hx     Social History Social History   Tobacco Use  . Smoking status: Current Every Day Smoker    Packs/day: 0.50    Types: Cigarettes  . Smokeless tobacco: Never Used  Substance Use Topics  . Alcohol use: No  . Drug use: No    Review of Systems Constitutional: No fever ENT: as above.  Cardiovascular: Denies chest pain. Respiratory: Denies shortness of breath. Gastrointestinal: No abdominal pain.  Genitourinary: Negative for dysuria. Musculoskeletal: Negative for back pain. Skin: Negative for rash.   ____________________________________________   PHYSICAL EXAM:  VITAL SIGNS: ED Triage Vitals  Enc Vitals Group     BP 10/17/17  1015 107/89     Pulse Rate 10/17/17 1015 84     Resp 10/17/17 1015 14     Temp 10/17/17 1015 98.3 F (36.8 C)     Temp Source 10/17/17 1015 Oral     SpO2 10/17/17 1015 98 %     Weight 10/17/17 1013 160 lb (72.6 kg)     Height 10/17/17 1013 5\' 5"  (1.651 m)     Head Circumference --      Peak Flow --      Pain Score 10/17/17 1012 6     Pain Loc --      Pain Edu? --      Excl. in [place]? --    Constitutional: Alert and  oriented. Well appearing and in no acute distress. Eyes: Conjunctivae are normal.  Head: Atraumatic.Mild tenderness to palpation bilateral frontal and maxillary sinuses. No swelling. No erythema.   Ears: no erythema, normal TMs bilaterally.   Nose: nasal congestion with bilateral nasal turbinate erythema and edema.   Mouth/Throat: Mucous membranes are moist.  Oropharynx non-erythematous.No tonsillar swelling or exudate.  Neck: No stridor.  No cervical spine tenderness to palpation. Hematological/Lymphatic/Immunilogical: No cervical lymphadenopathy. Cardiovascular: Normal rate, regular rhythm. Grossly normal heart sounds.  Good peripheral circulation. Respiratory: Normal respiratory effort.  No retractions.Good air movement.  Mild scattered inspiratory and expiratory wheezes throughout.  Mild scattered rhonchi.  No focal area of consolidation.  Speaks in complete sentences. Musculoskeletal: No lower extremity edema noted. Neurologic:  Normal speech and language. No gait instability. Skin:  Skin is warm, dry and intact. No rash noted. Psychiatric: Mood and affect are normal. Speech and behavior are normal. ___________________________________________   LABS (all labs ordered are listed, but only abnormal results are displayed)  Labs Reviewed - No data to display  PROCEDURES Procedures   INITIAL IMPRESSION / ASSESSMENT AND PLAN / ED COURSE  Pertinent labs & imaging results that were available during my care of the patient were reviewed by me and considered in my medical decision making (see chart for details).  Well-appearing patient.  No acute distress.  Suspect recent viral upper respiratory infection.  However discussed with patient and she declines known history of COPD, discussed possible underlying COPD.  Will treat patient with oral doxycycline, prednisone taper and refill albuterol inhaler.  Encourage rest, fluids, supportive care.Discussed indication, risks and benefits of  medications with patient.  Discussed follow up with Primary care physician this week. Discussed follow up and return parameters including no resolution or any worsening concerns. Patient verbalized understanding and agreed to plan.   ____________________________________________   FINAL CLINICAL IMPRESSION(S) / ED DIAGNOSES  Final diagnoses:  Cough  Wheezing     ED Discharge Orders         Ordered    predniSONE (DELTASONE) 10 MG tablet     10/17/17 1128    doxycycline (VIBRAMYCIN) 100 MG capsule  2 times daily     10/17/17 1128    albuterol (PROVENTIL HFA;VENTOLIN HFA) 108 (90 Base) MCG/ACT inhaler  Every 4 hours PRN     10/17/17 1128           Note: This dictation was prepared with Dragon dictation along with smaller phrase technology. Any transcriptional errors that result from this process are unintentional.         Marylene Land, NP 10/17/17 1309

## 2017-10-17 NOTE — Discharge Instructions (Addendum)
Take medication as prescribed. Rest. Drink plenty of fluids.  ° °Follow up with your primary care physician this week as needed. Return to Urgent care for new or worsening concerns.  ° °

## 2017-10-17 NOTE — ED Triage Notes (Signed)
Patient c/o cough and chest congestion since last Friday.  Patient denies fevers.

## 2017-10-24 ENCOUNTER — Other Ambulatory Visit: Payer: Self-pay

## 2017-10-24 ENCOUNTER — Ambulatory Visit (INDEPENDENT_AMBULATORY_CARE_PROVIDER_SITE_OTHER): Payer: BLUE CROSS/BLUE SHIELD

## 2017-10-24 ENCOUNTER — Ambulatory Visit
Admission: EM | Admit: 2017-10-24 | Discharge: 2017-10-24 | Disposition: A | Discharge status: Home / Self Care | Payer: BLUE CROSS/BLUE SHIELD | Attending: Family Medicine | Admitting: Family Medicine

## 2017-10-24 DIAGNOSIS — J0100 Acute maxillary sinusitis, unspecified: Secondary | ICD-10-CM | POA: Diagnosis not present

## 2017-10-24 DIAGNOSIS — R05 Cough: Secondary | ICD-10-CM

## 2017-10-24 DIAGNOSIS — J209 Acute bronchitis, unspecified: Secondary | ICD-10-CM | POA: Diagnosis not present

## 2017-10-24 MED ORDER — AMOXICILLIN-POT CLAVULANATE 875-125 MG PO TABS: 1.0000 | ORAL_TABLET | Freq: Two times a day (BID) | ORAL | 0 refills | Status: DC

## 2017-10-24 MED ORDER — BENZONATATE 100 MG PO CAPS: 100.0000 mg | ORAL_CAPSULE | Freq: Three times a day (TID) | ORAL | 0 refills | Status: DC | PRN

## 2017-10-24 MED ORDER — PREDNISONE 10 MG PO TABS: ORAL_TABLET | ORAL | 0 refills | Status: DC

## 2017-10-24 MED ORDER — IPRATROPIUM-ALBUTEROL 0.5-2.5 (3) MG/3ML IN SOLN
3.0000 mL | Freq: Once | RESPIRATORY_TRACT | Status: AC
  Administered 2017-10-24: 3 mL via RESPIRATORY_TRACT

## 2017-10-24 MED ORDER — HYDROCOD POLST-CPM POLST ER 10-8 MG/5ML PO SUER: 5.0000 mL | Freq: Every evening | ORAL | 0 refills | Status: DC | PRN

## 2017-10-24 NOTE — ED Triage Notes (Signed)
Patient complains of cough and congestion with mucus productive since 10/12/2017. Patient states that she was seen in here on 10/17/17 and given doxy and prednisone. Patient states that she has finished prednisone and a few doses left of antibiotic, patient states that she has not had any relief and feels as if she is worsening.

## 2017-10-24 NOTE — ED Provider Notes (Signed)
[facility] ____________________________________________  Time seen: Approximately 10:40 AM  I have reviewed the triage vital signs and the nursing notes.   HISTORY  Chief Complaint Cough  HPI Patricia Hoffman is a 47 y.o. female presented for evaluation of continued cough and congestion symptoms.  Patient was initially seen on 10/17/2016 for the same complaints, at which point she reports symptoms have only been present for 5 days.  It was discussed for concern of underlying COPD with viral upper respiratory infection.  Patient reports that she is now having more sinus congestion and sinus pain but still coughing and with intermittent wheezing.  Denies fevers.  States the cough has been disrupting her sleep.  Cough is predominantly a dry hacking cough.  Continues to eat and drink overall well.  Daughter recently sick with similar complaints.  Denies other aggravating or alleviating factors.  Reports otherwise doing well.  Continues use albuterol inhaler as prescribed with some improvement.  Has completed prednisone prescription and finishing doxycycline. Denies chest pain, shortness of breath, abdominal pain, extremity swelling.   [place]: PCP   Past Medical History:  Diagnosis Date  . Cancer ([place])     There are no active problems to display for this patient.   Past Surgical History:  Procedure Laterality Date  . ABDOMINAL HYSTERECTOMY    . BREAST CYST ASPIRATION Left 2016   left fna-neg  . CHOLECYSTECTOMY    . TONSILLECTOMY       No current facility-administered medications for this encounter.   Current Outpatient Medications:  .  albuterol (PROVENTIL HFA;VENTOLIN HFA) 108 (90 Base) MCG/ACT inhaler, Inhale 2 puffs into the lungs every 4 (four) hours as needed for wheezing., Disp: 1 Inhaler, Rfl: 0 .  amitriptyline (ELAVIL) 50 MG tablet, Take 50 mg by mouth at bedtime., Disp: , Rfl:  .  doxycycline (VIBRAMYCIN) 100 MG capsule, Take 1 capsule  (100 mg total) by mouth 2 (two) times daily., Disp: 20 capsule, Rfl: 0 .  FLUoxetine (PROZAC) 40 MG capsule, Take 40 mg by mouth daily., Disp: , Rfl:  .  fluticasone (FLONASE) 50 MCG/ACT nasal spray, Place 2 sprays into both nostrils daily., Disp: 16 g, Rfl: 0 .  meloxicam (MOBIC) 15 MG tablet, Take 1 tablet (15 mg total) by mouth daily as needed., Disp: 10 tablet, Rfl: 0 .  amoxicillin-clavulanate (AUGMENTIN) 875-125 MG tablet, Take 1 tablet by mouth every 12 (twelve) hours., Disp: 20 tablet, Rfl: 0 .  benzonatate (TESSALON PERLES) 100 MG capsule, Take 1 capsule (100 mg total) by mouth 3 (three) times daily as needed for cough., Disp: 15 capsule, Rfl: 0 .  chlorpheniramine-HYDROcodone (TUSSIONEX PENNKINETIC ER) 10-8 MG/5ML SUER, Take 5 mLs by mouth at bedtime as needed for cough. do not drive or operate machinery while taking as can cause drowsiness., Disp: 50 mL, Rfl: 0 .  fexofenadine-pseudoephedrine (ALLEGRA-D ALLERGY & CONGESTION) 180-240 MG 24 hr tablet, Take 1 tablet by mouth daily., Disp: 30 tablet, Rfl: 0 .  predniSONE (DELTASONE) 10 MG tablet, Start 60 mg po day one, then 50 mg po day two, taper by 10 mg daily until complete., Disp: 21 tablet, Rfl: 0  Allergies Patient has no known allergies.  Family History  Problem Relation Age of Onset  . Heart disease Father   . Breast cancer Neg Hx     Social History Social History   Tobacco Use  . Smoking status: Current Every Day Smoker    Packs/day: 0.50    Types: Cigarettes  .  Smokeless tobacco: Never Used  Substance Use Topics  . Alcohol use: No  . Drug use: No    Review of Systems Constitutional: No fever/chills Eyes: No visual changes. ENT: No sore throat. As above.  Cardiovascular: Denies chest pain. Respiratory: Denies shortness of breath. Gastrointestinal: No abdominal pain.  No nausea, no vomiting.  No diarrhea.  No constipation. Genitourinary: Negative for dysuria. Musculoskeletal: Negative for back pain. Skin:  Negative for rash.   ____________________________________________   PHYSICAL EXAM:  VITAL SIGNS: ED Triage Vitals  Enc Vitals Group     BP 10/24/17 1008 122/75     Pulse Rate 10/24/17 1008 77     Resp 10/24/17 1008 18     Temp 10/24/17 1008 98.3 F (36.8 C)     Temp Source 10/24/17 1008 Oral     SpO2 10/24/17 1008 100 %     Weight 10/24/17 1006 160 lb (72.6 kg)     Height 10/24/17 1006 5\' 5"  (1.651 m)     Head Circumference --      Peak Flow --      Pain Score 10/24/17 1006 7     Pain Loc --      Pain Edu? --      Excl. in [place]? --    Constitutional: Alert and oriented. Well appearing and in no acute distress. Eyes: Conjunctivae are normal.  Head: Atraumatic.Mild to moderate tenderness to palpation bilateral maxillary sinuses.  No frontal sinus tenderness palpation.  No swelling. No erythema.   Ears: no erythema, normal TMs bilaterally.   Nose: nasal congestion with bilateral nasal turbinate erythema and edema.   Mouth/Throat: Mucous membranes are moist.  Oropharynx non-erythematous.No tonsillar swelling or exudate.  Neck: No stridor.  No cervical spine tenderness to palpation. Hematological/Lymphatic/Immunilogical: No cervical lymphadenopathy. Cardiovascular: Normal rate, regular rhythm. Grossly normal heart sounds.  Good peripheral circulation. Respiratory: Normal respiratory effort.  No retractions.  Mild scattered expiratory wheezes.  No rhonchi.  Good air movement.  Dry intermittent cough in room with bronchospasm.  Speaks in complete sentences. Musculoskeletal: Steady gait.  No lower extremity edema noted. Neurologic:  Normal speech and language. No gait instability. Skin:  Skin is warm, dry. Psychiatric: Mood and affect are normal. Speech and behavior are normal.  ___________________________________________   LABS (all labs ordered are listed, but only abnormal results are displayed)  Labs Reviewed - No data to  display ____________________________________________  RADIOLOGY  Dg Chest 2 View  Result Date: 10/24/2017 CLINICAL DATA:  Cough, wheezing. EXAM: CHEST - 2 VIEW COMPARISON:  Radiographs of March 04, 2014. FINDINGS: The heart size and mediastinal contours are within normal limits. Both lungs are clear. No pneumothorax or pleural effusion is noted. The visualized skeletal structures are unremarkable. IMPRESSION: No active cardiopulmonary disease. Electronically Signed   By: Marijo Conception, M.D.   On: 10/24/2017 10:59   ____________________________________________   PROCEDURES Procedures    INITIAL IMPRESSION / ASSESSMENT AND PLAN / ED COURSE  Pertinent labs & imaging results that were available during my care of the patient were reviewed by me and considered in my medical decision making (see chart for details).  Overall well-appearing patient.  Continues with cough with intermittent wheezing.  Smoker.  No known diagnosis of COPD.  Also now with sinus pressure and thick nasal drainage.  Completing doxycycline.  Chest x-ray evaluated, per radiologist no active cardiopulmonary disease.  DuoNeb given in urgent care x1 with resolved wheezing afterwards, patient reports feeling better after neb.  Will treat  patient with repeat course of prednisone, continue albuterol inhaler as needed, PRN Tessalon Perles and PRN Tussionex.  Discussed will treat also with Augmentin for concern of sinusitis.  Discussed strict follow-up and return parameters, strongly encourage close follow-up with primary care.Discussed indication, risks and benefits of medications with patient.  Discussed follow up and return parameters including no resolution or any worsening concerns. Patient verbalized understanding and agreed to plan.   ____________________________________________   FINAL CLINICAL IMPRESSION(S) / ED DIAGNOSES  Final diagnoses:  Acute bronchitis, unspecified organism  Acute maxillary sinusitis,  recurrence not specified     ED Discharge Orders         Ordered    predniSONE (DELTASONE) 10 MG tablet     10/24/17 1122    amoxicillin-clavulanate (AUGMENTIN) 875-125 MG tablet  Every 12 hours     10/24/17 1122    chlorpheniramine-HYDROcodone (TUSSIONEX PENNKINETIC ER) 10-8 MG/5ML SUER  At bedtime PRN     10/24/17 1122    benzonatate (TESSALON PERLES) 100 MG capsule  3 times daily PRN     10/24/17 1122           Note: This dictation was prepared with Dragon dictation along with smaller phrase technology. Any transcriptional errors that result from this process are unintentional.         Marylene Land, NP 10/24/17 1209

## 2017-10-24 NOTE — Discharge Instructions (Addendum)
Take medication as prescribed. Rest. Drink plenty of fluids.  Continue albuterol inhaler as prescribed.  Follow up with your primary care physician this week as needed. Return to Urgent care for new or worsening concerns.

## 2017-12-09 ENCOUNTER — Ambulatory Visit
Admission: EM | Admit: 2017-12-09 | Discharge: 2017-12-09 | Disposition: A | Discharge status: Home / Self Care | Payer: BLUE CROSS/BLUE SHIELD | Attending: Emergency Medicine | Admitting: Emergency Medicine

## 2017-12-09 ENCOUNTER — Encounter: Payer: Self-pay | Admitting: Emergency Medicine

## 2017-12-09 ENCOUNTER — Other Ambulatory Visit: Payer: Self-pay

## 2017-12-09 DIAGNOSIS — M5441 Lumbago with sciatica, right side: Secondary | ICD-10-CM | POA: Diagnosis not present

## 2017-12-09 MED ORDER — CYCLOBENZAPRINE HCL 10 MG PO TABS: 10.0000 mg | ORAL_TABLET | Freq: Two times a day (BID) | ORAL | 0 refills | Status: DC | PRN

## 2017-12-09 MED ORDER — MELOXICAM 15 MG PO TABS: 15.0000 mg | ORAL_TABLET | Freq: Every day | ORAL | 0 refills | Status: DC | PRN

## 2017-12-09 NOTE — ED Provider Notes (Signed)
[facility] ____________________________________________  Time seen: Approximately 9:10 AM  I have reviewed the triage vital signs and the nursing notes.   HISTORY  Chief Complaint Back Pain   HPI Patricia Hoffman is a 47 y.o. female presented for evaluation of low back pain present for the last 3 days.  States that she woke up in the middle the night and felt some pain to her lower back and then worsened as she woke up in the 10th the next day.  Patient states that she does do a lot of pushing and pulling and twisting at work and thinks that she may have pulled something in her back.  Denies any known specific injury, abrupt onset, fall or direct trauma.  Reports she has had a history of similar back pain in the past that improved with muscle relaxers.  Has been taking intermittent over-the-counter ibuprofen with minimal improvement, no resolution.  States that she lays completely still or sits completely still she has no pain.  States pain is predominantly with bending down and back up.  Occasionally pain radiates to right leg.  Denies rash, urinary or bowel retention or incontinence, abdominal pain, chest pain, shortness of breath, paresthesias, weakness, unilateral weakness.  Has continue to remain active.  [place]: PCP    Past Medical History:  Diagnosis Date  . Cancer ([place])     There are no active problems to display for this patient.   Past Surgical History:  Procedure Laterality Date  . ABDOMINAL HYSTERECTOMY    . BREAST CYST ASPIRATION Left 2016   left fna-neg  . CHOLECYSTECTOMY    . TONSILLECTOMY       No current facility-administered medications for this encounter.   Current Outpatient Medications:  .  albuterol (PROVENTIL HFA;VENTOLIN HFA) 108 (90 Base) MCG/ACT inhaler, Inhale 2 puffs into the lungs every 4 (four) hours as needed for wheezing., Disp: 1 Inhaler, Rfl: 0 .  amitriptyline (ELAVIL) 50 MG tablet, Take 50 mg by mouth at  bedtime., Disp: , Rfl:  .  amoxicillin-clavulanate (AUGMENTIN) 875-125 MG tablet, Take 1 tablet by mouth every 12 (twelve) hours., Disp: 20 tablet, Rfl: 0 .  fexofenadine-pseudoephedrine (ALLEGRA-D ALLERGY & CONGESTION) 180-240 MG 24 hr tablet, Take 1 tablet by mouth daily., Disp: 30 tablet, Rfl: 0 .  FLUoxetine (PROZAC) 40 MG capsule, Take 40 mg by mouth daily., Disp: , Rfl:  .  fluticasone (FLONASE) 50 MCG/ACT nasal spray, Place 2 sprays into both nostrils daily., Disp: 16 g, Rfl: 0 .  predniSONE (DELTASONE) 10 MG tablet, Start 60 mg po day one, then 50 mg po day two, taper by 10 mg daily until complete., Disp: 21 tablet, Rfl: 0 .  cyclobenzaprine (FLEXERIL) 10 MG tablet, Take 1 tablet (10 mg total) by mouth 2 (two) times daily as needed for muscle spasms. Do not drive while taking as can cause drowsiness, Disp: 15 tablet, Rfl: 0 .  meloxicam (MOBIC) 15 MG tablet, Take 1 tablet (15 mg total) by mouth daily as needed., Disp: 10 tablet, Rfl: 0  Allergies Patient has no known allergies.  Family History  Problem Relation Age of Onset  . Heart disease Father   . Breast cancer Neg Hx     Social History Social History   Tobacco Use  . Smoking status: Current Every Day Smoker    Packs/day: 0.50    Types: Cigarettes  . Smokeless tobacco: Never Used  Substance Use Topics  . Alcohol use: No  . Drug  use: No    Review of Systems Constitutional: No fever Cardiovascular: Denies chest pain. Respiratory: Denies shortness of breath. Gastrointestinal: No abdominal pain.  No nausea, no vomiting.  No diarrhea.  No constipation. Genitourinary: Negative for dysuria. Musculoskeletal: positive for back pain. Skin: Negative for rash.   ____________________________________________   PHYSICAL EXAM:  VITAL SIGNS: ED Triage Vitals  Enc Vitals Group     BP 12/09/17 0901 (!) 127/93     Pulse Rate 12/09/17 0901 76     Resp 12/09/17 0901 18     Temp 12/09/17 0901 98.5 F (36.9 C)     Temp  Source 12/09/17 0901 Oral     SpO2 12/09/17 0901 100 %     Weight 12/09/17 0903 160 lb (72.6 kg)     Height 12/09/17 0903 5\' 6"  (1.676 m)     Head Circumference --      Peak Flow --      Pain Score 12/09/17 0903 6     Pain Loc --      Pain Edu? --      Excl. in [place]? --     Constitutional: Alert and oriented. Well appearing and in no acute distress. ENT      Head: Normocephalic and atraumatic. Cardiovascular: Normal rate, regular rhythm. Grossly normal heart sounds.  Good peripheral circulation. Respiratory: Normal respiratory effort without tachypnea nor retractions. Breath sounds are clear and equal bilaterally. No wheezes, rales, rhonchi. Gastrointestinal: Soft and nontender. No CVA tenderness. Musculoskeletal: No midline cervical or thoracic tenderness to palpation.  Minimal lower midline L4 and L5 tenderness to palpation with moderate tenderness bilateral paralumbar right greater than left tenderness and along right piriformis, no saddle anesthesia, no pain with standing bilateral knee lifts, changes positions quickly, bilateral plantar flexion dorsiflexion strong and equal, mild pain with right and left rotation, mild pain with lumbar flexion, mild to moderate pain with lumbar extension but full range of motion present. Neurologic:  Normal speech and language. No gross focal neurologic deficits are appreciated. Speech is normal. No gait instability.  Skin:  Skin is warm, dry and intact. No rash noted. Psychiatric: Mood and affect are normal. Speech and behavior are normal. Patient exhibits appropriate insight and judgment   ___________________________________________   LABS (all labs ordered are listed, but only abnormal results are displayed)  Labs Reviewed - No data to display   PROCEDURES Procedures    INITIAL IMPRESSION / ASSESSMENT AND PLAN / ED COURSE  Pertinent labs & imaging results that were available during my care of the patient were reviewed by me and considered  in my medical decision making (see chart for details).  Overall well-appearing patient.  No acute distress.  Lower back pain, worse right side with occasional right radiation.  Suspect lumbar strain.  Is no fall or direct trauma will defer x-ray at this time, patient agrees.  Will treat with oral Mobic and PRN muscle relaxant.  Encourage stretching, ice, heat, supportive care.  Follow-up for continued pain.  Work note given for today and tomorrow.Discussed indication, risks and benefits of medications with patient.  Discussed follow up with Primary care physician this week. Discussed follow up and return parameters including no resolution or any worsening concerns. Patient verbalized understanding and agreed to plan.   ____________________________________________   FINAL CLINICAL IMPRESSION(S) / ED DIAGNOSES  Final diagnoses:  Acute right-sided low back pain with right-sided sciatica     ED Discharge Orders         Ordered  meloxicam (MOBIC) 15 MG tablet  Daily PRN     12/09/17 0913    cyclobenzaprine (FLEXERIL) 10 MG tablet  2 times daily PRN     12/09/17 0913           Note: This dictation was prepared with Dragon dictation along with smaller phrase technology. Any transcriptional errors that result from this process are unintentional.         Marylene Land, NP 12/09/17 1016

## 2017-12-09 NOTE — ED Triage Notes (Signed)
Patient c/o low back pain that started on Friday. She also states she is having left arm pain.

## 2017-12-09 NOTE — Discharge Instructions (Addendum)
Take medication as prescribed. Rest. Drink plenty of fluids. Stretch. Ice. Heat.  ° °Follow up with your primary care physician this week as needed. Return to Urgent care for new or worsening concerns.  ° °

## 2018-02-06 ENCOUNTER — Encounter: Payer: Self-pay | Admitting: Emergency Medicine

## 2018-02-06 ENCOUNTER — Other Ambulatory Visit: Payer: Self-pay

## 2018-02-06 ENCOUNTER — Ambulatory Visit
Admission: EM | Admit: 2018-02-06 | Discharge: 2018-02-06 | Disposition: A | Discharge status: Home / Self Care | Payer: BLUE CROSS/BLUE SHIELD | Attending: Family Medicine | Admitting: Family Medicine

## 2018-02-06 ENCOUNTER — Ambulatory Visit (INDEPENDENT_AMBULATORY_CARE_PROVIDER_SITE_OTHER): Payer: BLUE CROSS/BLUE SHIELD

## 2018-02-06 DIAGNOSIS — M654 Radial styloid tenosynovitis [de Quervain]: Secondary | ICD-10-CM | POA: Insufficient documentation

## 2018-02-06 DIAGNOSIS — M25531 Pain in right wrist: Secondary | ICD-10-CM | POA: Diagnosis not present

## 2018-02-06 MED ORDER — DICLOFENAC SODIUM 75 MG PO TBEC: 75.0000 mg | DELAYED_RELEASE_TABLET | Freq: Two times a day (BID) | ORAL | 1 refills | Status: DC | PRN

## 2018-02-06 NOTE — ED Provider Notes (Signed)
[facility]    CSN: [id number] Arrival date & time: 02/06/18  1139  History   Chief Complaint Chief Complaint  Patient presents with  . Wrist Pain   HPI  48 year old female presents with right wrist pain.  2 to 3-week history of right wrist pain.  Located on the radial aspect.  No fall, trauma, injury.  Not improving.  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  Exacerbated by repetitive activities at work.  No relieving factors.  No other associated symptoms.  No other complaints.  PMH, Surgical Hx, Family Hx, Social History reviewed and updated as below.  PMH: IBS (irritable bowel syndrome)    GERD (gastroesophageal reflux disease)    Depression    Smoker    Headache(784.0)    Positive H. 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 ABDOMINAL HYSTERECTOMY    . BREAST CYST ASPIRATION Left 2016   left fna-neg  . CHOLECYSTECTOMY    . TONSILLECTOMY    Wrist surgery  OB History   No obstetric history on file.      Home Medications    Prior to Admission medications   Medication Sig Start Date End Date Taking? Authorizing Provider  albuterol (PROVENTIL HFA;VENTOLIN HFA) 108 (90 Base) MCG/ACT inhaler Inhale 2 puffs into the lungs every 4 (four) hours as needed for wheezing. 10/17/17  Yes Marylene Land, NP  amitriptyline (ELAVIL) 50 MG tablet Take 50 mg by mouth at bedtime.   Yes [provider]  fexofenadine-pseudoephedrine (ALLEGRA-D ALLERGY & CONGESTION) 180-240 MG 24 hr tablet Take 1  tablet by mouth daily. 02/04/16  Yes Frederich Cha, MD  FLUoxetine (PROZAC) 40 MG capsule Take 40 mg by mouth daily.   Yes [provider]  fluticasone (FLONASE) 50 MCG/ACT nasal spray Place 2 sprays into both nostrils daily. 02/04/16  Yes Frederich Cha, MD  diclofenac (VOLTAREN) 75 MG EC tablet Take 1 tablet (75 mg total) by mouth 2 (two) times daily as needed. 02/06/18   Coral Spikes, DO    Family History Family History  Problem Relation Age of Onset  . Heart disease Father   . Breast cancer Neg Hx     Social History Social History   Tobacco Use  . Smoking status: Current Every Day Smoker    Packs/day: 0.50    Types: Cigarettes  . Smokeless tobacco: Never Used  Substance Use Topics  . Alcohol use: No  . Drug use: No     Allergies   Patient has no known allergies.   Review of Systems Review of Systems  Constitutional: Negative.   Musculoskeletal:       Right wrist pain.   Physical Exam Triage Vital Signs ED Triage Vitals  Enc Vitals Group     BP 02/06/18 1217 (!) 129/98     Pulse Rate 02/06/18 1217 68     Resp 02/06/18 1217 18     Temp 02/06/18 1217 99.9 F (37.7 C)     Temp Source 02/06/18 1217 Oral     SpO2  02/06/18 1217 100 %     Weight 02/06/18 1215 160 lb (72.6 kg)     Height 02/06/18 1215 5\' 6"  (1.676 m)     Head Circumference --      Peak Flow --      Pain Score 02/06/18 1214 8     Pain Loc --      Pain Edu? --      Excl. in [place]? --    Updated Vital Signs BP (!) 129/98 (BP Location: Right Arm)   Pulse 68   Temp 99.9 F (37.7 C) (Oral)   Resp 18   Ht 5\' 6"  (1.676 m)   Wt 72.6 kg   SpO2 100%   BMI 25.82 kg/m   Visual Acuity Right Eye Distance:   Left Eye Distance:   Bilateral Distance:    Right Eye Near:   Left Eye Near:    Bilateral Near:     Physical Exam Vitals signs and nursing note reviewed.  Constitutional:      General: She is not in acute distress. HENT:     Head: Normocephalic and atraumatic.  Eyes:     General:  No scleral icterus.    Conjunctiva/sclera: Conjunctivae normal.  Pulmonary:     Effort: Pulmonary effort is normal. No respiratory distress.  Musculoskeletal:     Comments: Right wrist -patient with tenderness on the radial aspect of the wrist.  Positive Finkelstein test.  Skin:    General: Skin is warm.     Findings: No rash.  Neurological:     Mental Status: She is alert.  Psychiatric:        Mood and Affect: Mood normal.        Behavior: Behavior normal.    UC Treatments / Results  Labs (all labs ordered are listed, but only abnormal results are displayed) Labs Reviewed - No data to display  EKG None  Radiology Dg Wrist Complete Right  Result Date: 02/06/2018 CLINICAL DATA:  2-3 week history of RIGHT wrist pain and swelling. No known injuries. EXAM: RIGHT WRIST - COMPLETE 3+ VIEW COMPARISON:  None. FINDINGS: No evidence of acute or subacute fracture or dislocation. Joint spaces well preserved. Well-preserved bone mineral density. No intrinsic osseous abnormalities. IMPRESSION: Normal examination. Electronically Signed   By: Evangeline Dakin M.D.   On: 02/06/2018 12:32    Procedures Procedures (including critical care time)  Medications Ordered in UC Medications - No data to display  Initial Impression / Assessment and Plan / UC Course  I have reviewed the triage vital signs and the nursing notes.  Pertinent labs & imaging results that were available during my care of the patient were reviewed by me and considered in my medical decision making (see chart for details).    48 year old female presents with de Quervain's tenosynovitis.  Placed in thumb spica splint.  Diclofenac as prescribed.  Advised to see orthopedist.  Final Clinical Impressions(s) / UC Diagnoses   Final diagnoses:  De Quervain's tenosynovitis, right   Discharge Instructions   None    ED Prescriptions    Medication Sig Dispense Auth. Provider   diclofenac (VOLTAREN) 75 MG EC tablet Take 1 tablet  (75 mg total) by mouth 2 (two) times daily as needed. 60 tablet Coral Spikes, DO     Controlled Substance Prescriptions [place] Controlled Substance Registry consulted? Not Applicable   Coral Spikes, DO 02/06/18 1257

## 2018-02-06 NOTE — ED Triage Notes (Signed)
Patient c/o right wrist pain that started 2-3 weeks ago. Denies injury to her wrist.

## 2018-03-27 ENCOUNTER — Other Ambulatory Visit: Payer: Self-pay

## 2018-03-27 ENCOUNTER — Ambulatory Visit
Admission: EM | Admit: 2018-03-27 | Discharge: 2018-03-27 | Disposition: A | Discharge status: Home / Self Care | Payer: BLUE CROSS/BLUE SHIELD | Attending: Family Medicine | Admitting: Family Medicine

## 2018-03-27 DIAGNOSIS — J0100 Acute maxillary sinusitis, unspecified: Secondary | ICD-10-CM | POA: Diagnosis not present

## 2018-03-27 DIAGNOSIS — B9789 Other viral agents as the cause of diseases classified elsewhere: Secondary | ICD-10-CM | POA: Diagnosis not present

## 2018-03-27 DIAGNOSIS — Z720 Tobacco use: Secondary | ICD-10-CM

## 2018-03-27 DIAGNOSIS — R059 Cough, unspecified: Secondary | ICD-10-CM

## 2018-03-27 DIAGNOSIS — R05 Cough: Secondary | ICD-10-CM

## 2018-03-27 MED ORDER — HYDROCOD POLST-CPM POLST ER 10-8 MG/5ML PO SUER: 5.0000 mL | Freq: Two times a day (BID) | ORAL | 0 refills | Status: DC | PRN

## 2018-03-27 MED ORDER — DOXYCYCLINE HYCLATE 100 MG PO TABS: 100.0000 mg | ORAL_TABLET | Freq: Two times a day (BID) | ORAL | 0 refills | Status: DC

## 2018-03-27 NOTE — ED Provider Notes (Signed)
[facility]    CSN: [id number] Arrival date & time: 03/27/18  0931     History   Chief Complaint Chief Complaint  Patient presents with  . Cough    HPI Patricia Hoffman is a 48 y.o. female.   The history is provided by the patient.  URI  Presenting symptoms: congestion, cough, facial pain and rhinorrhea   Severity:  Moderate Onset quality:  Sudden Duration:  1 month Timing:  Constant Progression:  Worsening Chronicity:  New Relieved by:  Nothing Ineffective treatments:  OTC medications Associated symptoms: no wheezing   Risk factors: sick contacts     Past Medical History:  Diagnosis Date  . Cancer ([place])     There are no active problems to display for this patient.   Past Surgical History:  Procedure Laterality Date  . ABDOMINAL HYSTERECTOMY    . BREAST CYST ASPIRATION Left 2016   left fna-neg  . CHOLECYSTECTOMY    . TONSILLECTOMY      OB History   No obstetric history on file.      Home Medications    Prior to Admission medications   Medication Sig Start Date End Date Taking? Authorizing Provider  albuterol (PROVENTIL HFA;VENTOLIN HFA) 108 (90 Base) MCG/ACT inhaler Inhale 2 puffs into the lungs every 4 (four) hours as needed for wheezing. 10/17/17  Yes Marylene Land, NP  amitriptyline (ELAVIL) 50 MG tablet Take 50 mg by mouth at bedtime.   Yes [provider]  diclofenac (VOLTAREN) 75 MG EC tablet Take 1 tablet (75 mg total) by mouth 2 (two) times daily as needed. 02/06/18  Yes Cook, Jayce G, DO  fexofenadine-pseudoephedrine (ALLEGRA-D ALLERGY & CONGESTION) 180-240 MG 24 hr tablet Take 1 tablet by mouth daily. 02/04/16  Yes Frederich Cha, MD  FLUoxetine (PROZAC) 40 MG capsule Take 40 mg by mouth daily.   Yes [provider]  chlorpheniramine-HYDROcodone (TUSSIONEX PENNKINETIC ER) 10-8 MG/5ML SUER Take 5 mLs by mouth every 12 (twelve) hours as needed. 03/27/18   Norval Gable, MD  doxycycline (VIBRA-TABS) 100 MG tablet  Take 1 tablet (100 mg total) by mouth 2 (two) times daily. 03/27/18   Norval Gable, MD  fluticasone (FLONASE) 50 MCG/ACT nasal spray Place 2 sprays into both nostrils daily. 02/04/16   Frederich Cha, MD    Family History Family History  Problem Relation Age of Onset  . Heart disease Father   . Breast cancer Neg Hx     Social History Social History   Tobacco Use  . Smoking status: Current Every Day Smoker    Packs/day: 0.10    Types: Cigarettes  . Smokeless tobacco: Never Used  Substance Use Topics  . Alcohol use: No  . Drug use: No     Allergies   Patient has no known allergies.   Review of Systems Review of Systems  HENT: Positive for congestion and rhinorrhea.   Respiratory: Positive for cough. Negative for wheezing.      Physical Exam Triage Vital Signs ED Triage Vitals  Enc Vitals Group     BP 03/27/18 0946 (!) 114/96     Pulse Rate 03/27/18 0946 76     Resp 03/27/18 0946 18     Temp 03/27/18 0946 98.1 F (36.7 C)     Temp Source 03/27/18 0946 Oral     SpO2 03/27/18 0946 100 %     Weight 03/27/18 0944 160 lb (72.6 kg)     Height 03/27/18 0944 5\' 6"  (1.676 m)  Head Circumference --      Peak Flow --      Pain Score 03/27/18 0944 5     Pain Loc --      Pain Edu? --      Excl. in [place]? --    No data found.  Updated Vital Signs BP (!) 114/96 (BP Location: Right Arm)   Pulse 76   Temp 98.1 F (36.7 C) (Oral)   Resp 18   Ht 5\' 6"  (1.676 m)   Wt 72.6 kg   SpO2 100%   BMI 25.82 kg/m   Visual Acuity Right Eye Distance:   Left Eye Distance:   Bilateral Distance:    Right Eye Near:   Left Eye Near:    Bilateral Near:     Physical Exam Vitals signs and nursing note reviewed.  Constitutional:      General: She is not in acute distress.    Appearance: She is well-developed. She is not toxic-appearing or diaphoretic.  HENT:     Head: Normocephalic and atraumatic.     Right Ear: Tympanic membrane, ear canal and external ear normal.     Left  Ear: Tympanic membrane, ear canal and external ear normal.     Nose: Mucosal edema and rhinorrhea present. No nasal deformity, septal deviation or laceration.     Right Sinus: Maxillary sinus tenderness and frontal sinus tenderness present.     Left Sinus: Maxillary sinus tenderness and frontal sinus tenderness present.     Mouth/Throat:     Pharynx: Uvula midline. No oropharyngeal exudate.  Neck:     Musculoskeletal: Normal range of motion and neck supple.     Thyroid: No thyromegaly.  Cardiovascular:     Rate and Rhythm: Normal rate and regular rhythm.     Heart sounds: Normal heart sounds.  Pulmonary:     Effort: Pulmonary effort is normal. No respiratory distress.     Breath sounds: Normal breath sounds. No stridor. No wheezing, rhonchi or rales.  Lymphadenopathy:     Cervical: No cervical adenopathy.  Neurological:     Mental Status: She is alert.      UC Treatments / Results  Labs (all labs ordered are listed, but only abnormal results are displayed) Labs Reviewed - No data to display  EKG None  Radiology No results found.  Procedures Procedures (including critical care time)  Medications Ordered in UC Medications - No data to display  Initial Impression / Assessment and Plan / UC Course  I have reviewed the triage vital signs and the nursing notes.  Pertinent labs & imaging results that were available during my care of the patient were reviewed by me and considered in my medical decision making (see chart for details).      Final Clinical Impressions(s) / UC Diagnoses   Final diagnoses:  Acute maxillary sinusitis, recurrence not specified  Cough    ED Prescriptions    Medication Sig Dispense Auth. Provider   doxycycline (VIBRA-TABS) 100 MG tablet Take 1 tablet (100 mg total) by mouth 2 (two) times daily. 20 tablet Norval Gable, MD   chlorpheniramine-HYDROcodone [facility] ER) 10-8 MG/5ML SUER Take 5 mLs by mouth every 12 (twelve) hours as  needed. 60 mL Norval Gable, MD     1. diagnosis reviewed with patient 2. rx as per orders above; reviewed possible side effects, interactions, risks and benefits  3. Recommend supportive treatment with rest, fluids, otc flonase 4. Follow-up prn if symptoms worsen or don't improve  Controlled Substance Prescriptions [place] Controlled Substance Registry consulted? Not Applicable   Norval Gable, MD 03/27/18 1012

## 2018-03-27 NOTE — ED Triage Notes (Signed)
Patient complains of cough, congestion, headache, sinus pain and pressure. Patient states that she had similar symptoms a few weeks ago and had improved but symptoms returned on Saturday.

## 2018-07-23 ENCOUNTER — Other Ambulatory Visit: Payer: Self-pay | Admitting: Medical Oncology

## 2018-07-23 DIAGNOSIS — Z872 Personal history of diseases of the skin and subcutaneous tissue: Secondary | ICD-10-CM

## 2018-07-23 DIAGNOSIS — N644 Mastodynia: Secondary | ICD-10-CM

## 2018-11-26 ENCOUNTER — Other Ambulatory Visit: Payer: Self-pay | Admitting: Podiatry

## 2018-11-26 DIAGNOSIS — M722 Plantar fascial fibromatosis: Secondary | ICD-10-CM

## 2018-12-03 ENCOUNTER — Other Ambulatory Visit: Payer: Self-pay

## 2018-12-03 ENCOUNTER — Ambulatory Visit
Admission: RE | Admit: 2018-12-03 | Discharge: 2018-12-03 | Disposition: A | Discharge status: Home / Self Care | Payer: BC Managed Care – PPO | Source: Ambulatory Visit | Attending: Podiatry | Admitting: Podiatry

## 2018-12-03 DIAGNOSIS — M722 Plantar fascial fibromatosis: Secondary | ICD-10-CM | POA: Insufficient documentation

## 2018-12-10 ENCOUNTER — Other Ambulatory Visit: Payer: Self-pay | Admitting: Podiatry

## 2018-12-11 ENCOUNTER — Other Ambulatory Visit: Payer: Self-pay

## 2018-12-11 ENCOUNTER — Encounter
Admission: RE | Admit: 2018-12-11 | Discharge: 2018-12-11 | Disposition: A | Discharge status: Home / Self Care | Payer: BC Managed Care – PPO | Source: Ambulatory Visit | Attending: Podiatry | Admitting: Podiatry

## 2018-12-11 DIAGNOSIS — Z20828 Contact with and (suspected) exposure to other viral communicable diseases: Secondary | ICD-10-CM | POA: Insufficient documentation

## 2018-12-11 DIAGNOSIS — Z01812 Encounter for preprocedural laboratory examination: Secondary | ICD-10-CM | POA: Diagnosis not present

## 2018-12-11 HISTORY — DX: Gastro-esophageal reflux disease without esophagitis: K21.9

## 2018-12-11 HISTORY — DX: Headache, unspecified: R51.9

## 2018-12-11 LAB — SARS CORONAVIRUS 2 (TAT 6-24 HRS): SARS Coronavirus 2: NEGATIVE

## 2018-12-11 NOTE — Patient Instructions (Signed)
Your procedure is scheduled on: 12/13/2018 Fri Report to Same Day Surgery 2nd floor medical mall [facility] Entrance-take elevator on left to 2nd floor.  Check in with surgery information desk.) To find out your arrival time please call [phone] between 1PM - 3PM on 12/12/1998 Thurs  Remember: Instructions that are not followed completely may result in serious medical risk, up to and including death, or upon the discretion of your surgeon and anesthesiologist your surgery may need to be rescheduled.    _x___ 1. Do not eat food after midnight the night before your procedure. You may drink clear liquids up to 2 hours before you are scheduled to arrive at the hospital for your procedure.  Do not drink clear liquids within 2 hours of your scheduled arrival to the hospital.  Clear liquids include  --Water or Apple juice without pulp  --Clear carbohydrate beverage such as ClearFast or Gatorade  --Black Coffee or Clear Tea (No milk, no creamers, do not add anything to                  the coffee or Tea Type 1 and type 2 diabetics should only drink water.   ____Ensure clear carbohydrate drink on the way to the hospital for bariatric patients  _x___Ensure clear carbohydrate drink 3 hours before surgery.   No gum chewing or hard candies.     __x__ 2. No Alcohol for 24 hours before or after surgery.   __x__3. No Smoking or e-cigarettes for 24 prior to surgery.  Do not use any chewable tobacco products for at least 6 hour prior to surgery   ____  4. Bring all medications with you on the day of surgery if instructed.    __x__ 5. Notify your doctor if there is any change in your medical condition     (cold, fever, infections).    x___6. On the morning of surgery brush your teeth with toothpaste and water.  You may rinse your mouth with mouth wash if you wish.  Do not swallow any toothpaste or mouthwash.   Do not wear jewelry, make-up, hairpins, clips or nail polish.  Do not wear lotions,  powders, or perfumes. You may wear deodorant.  Do not shave 48 hours prior to surgery. Men may shave face and neck.  Do not bring valuables to the hospital.    [facility] is not responsible for any belongings or valuables.               Contacts, dentures or bridgework may not be worn into surgery.  Leave your suitcase in the car. After surgery it may be brought to your room.  For patients admitted to the hospital, discharge time is determined by your                       treatment team.  _  Patients discharged the day of surgery will not be allowed to drive home.  You will need someone to drive you home and stay with you the night of your procedure.    Please read over the following fact sheets that you were given:   [facility] Preparing for Surgery and or MRSA Information   _x___ Take anti-hypertensive listed below, cardiac, seizure, asthma,     anti-reflux and psychiatric medicines. These include:  1. albuterol (PROVENTIL HFA;VENTOLIN HFA) 108 (90 Base) MCG/ACT inhaler  2.omeprazole (PRILOSEC) 40 MG capsule  3.  4.  5.  6.  ____Fleets enema  or Magnesium Citrate as directed.   _x___ Use CHG Soap or sage wipes as directed on instruction sheet   ____ Use inhalers on the day of surgery and bring to hospital day of surgery  ____ Stop Metformin and Janumet 2 days prior to surgery.    ____ Take 1/2 of usual insulin dose the night before surgery and none on the morning     surgery.   _x___ Follow recommendations from Cardiologist, Pulmonologist or PCP regarding          stopping Aspirin, Coumadin, Plavix ,Eliquis, Effient, or Pradaxa, and Pletal.  X____Stop Anti-inflammatories such as Advil, Aleve, Ibuprofen, Motrin, Naproxen, Naprosyn, Goodies powders or aspirin products. OK to take Tylenol and                          Celebrex.   _x___ Stop supplements until after surgery.  But may continue Vitamin D, Vitamin B,       and multivitamin.   ____ Bring C-Pap to the hospital.

## 2018-12-11 NOTE — Pre-Procedure Instructions (Signed)
Ensure carbohydrate drink and incentive spirometry given along with instructions.

## 2018-12-13 ENCOUNTER — Encounter
Admission: RE | Disposition: A | Discharge status: Home / Self Care | Payer: Self-pay | Source: Home / Self Care | Attending: Podiatry

## 2018-12-13 ENCOUNTER — Other Ambulatory Visit: Payer: Self-pay

## 2018-12-13 ENCOUNTER — Ambulatory Visit: Payer: BC Managed Care – PPO | Admitting: Certified Registered Nurse Anesthetist

## 2018-12-13 ENCOUNTER — Ambulatory Visit
Admission: RE | Admit: 2018-12-13 | Discharge: 2018-12-13 | Disposition: A | Discharge status: Home / Self Care | Payer: BC Managed Care – PPO | Attending: Podiatry | Admitting: Podiatry

## 2018-12-13 ENCOUNTER — Encounter: Payer: Self-pay | Admitting: *Deleted

## 2018-12-13 DIAGNOSIS — M722 Plantar fascial fibromatosis: Secondary | ICD-10-CM | POA: Insufficient documentation

## 2018-12-13 DIAGNOSIS — F329 Major depressive disorder, single episode, unspecified: Secondary | ICD-10-CM | POA: Diagnosis not present

## 2018-12-13 DIAGNOSIS — Z79899 Other long term (current) drug therapy: Secondary | ICD-10-CM | POA: Insufficient documentation

## 2018-12-13 DIAGNOSIS — K219 Gastro-esophageal reflux disease without esophagitis: Secondary | ICD-10-CM | POA: Insufficient documentation

## 2018-12-13 DIAGNOSIS — G5762 Lesion of plantar nerve, left lower limb: Secondary | ICD-10-CM | POA: Insufficient documentation

## 2018-12-13 DIAGNOSIS — F17210 Nicotine dependence, cigarettes, uncomplicated: Secondary | ICD-10-CM | POA: Diagnosis not present

## 2018-12-13 DIAGNOSIS — K589 Irritable bowel syndrome without diarrhea: Secondary | ICD-10-CM | POA: Diagnosis not present

## 2018-12-13 HISTORY — PX: NERVE REPAIR: SHX2083

## 2018-12-13 HISTORY — PX: PLANTAR FASCIA RELEASE: SHX2239

## 2018-12-13 SURGERY — RELEASE, FASCIA, PLANTAR
Anesthesia: General | Laterality: Left

## 2018-12-13 MED ORDER — KETAMINE HCL 50 MG/ML IJ SOLN
INTRAMUSCULAR | Status: DC | PRN
  Administered 2018-12-13: 25 mg via INTRAVENOUS

## 2018-12-13 MED ORDER — EPINEPHRINE PF 1 MG/ML IJ SOLN
INTRAMUSCULAR | Status: AC
  Filled 2018-12-13: qty 1

## 2018-12-13 MED ORDER — CEFAZOLIN SODIUM-DEXTROSE 2-4 GM/100ML-% IV SOLN
2.0000 g | INTRAVENOUS | Status: AC
  Administered 2018-12-13: 2 g via INTRAVENOUS

## 2018-12-13 MED ORDER — BUPIVACAINE LIPOSOME 1.3 % IJ SUSP
INTRAMUSCULAR | Status: AC
  Filled 2018-12-13: qty 20

## 2018-12-13 MED ORDER — ALBUTEROL SULFATE HFA 108 (90 BASE) MCG/ACT IN AERS
INHALATION_SPRAY | RESPIRATORY_TRACT | Status: AC
  Filled 2018-12-13: qty 6.7

## 2018-12-13 MED ORDER — LIDOCAINE HCL (PF) 1 % IJ SOLN
INTRAMUSCULAR | Status: AC
  Filled 2018-12-13: qty 30

## 2018-12-13 MED ORDER — BUPIVACAINE HCL (PF) 0.25 % IJ SOLN
INTRAMUSCULAR | Status: AC
  Filled 2018-12-13: qty 30

## 2018-12-13 MED ORDER — BUPIVACAINE-EPINEPHRINE (PF) 0.25% -1:200000 IJ SOLN
INTRAMUSCULAR | Status: DC | PRN
  Administered 2018-12-13: 10 mL

## 2018-12-13 MED ORDER — LACTATED RINGERS IV SOLN
INTRAVENOUS | Status: DC
  Administered 2018-12-13: 10:00:00 via INTRAVENOUS

## 2018-12-13 MED ORDER — POVIDONE-IODINE 7.5 % EX SOLN
Freq: Once | CUTANEOUS | Status: DC
  Filled 2018-12-13: qty 118

## 2018-12-13 MED ORDER — MIDAZOLAM HCL 2 MG/2ML IJ SOLN
INTRAMUSCULAR | Status: DC | PRN
  Administered 2018-12-13: 2 mg via INTRAVENOUS

## 2018-12-13 MED ORDER — CEFAZOLIN SODIUM-DEXTROSE 2-4 GM/100ML-% IV SOLN
INTRAVENOUS | Status: AC
  Filled 2018-12-13: qty 100

## 2018-12-13 MED ORDER — MIDAZOLAM HCL 2 MG/2ML IJ SOLN
INTRAMUSCULAR | Status: AC
  Filled 2018-12-13: qty 2

## 2018-12-13 MED ORDER — BUPIVACAINE HCL (PF) 0.5 % IJ SOLN
INTRAMUSCULAR | Status: AC
  Filled 2018-12-13: qty 30

## 2018-12-13 MED ORDER — PHENYLEPHRINE HCL (PRESSORS) 10 MG/ML IV SOLN
INTRAVENOUS | Status: DC | PRN
  Administered 2018-12-13: 100 ug via INTRAVENOUS
  Administered 2018-12-13 (×2): 50 ug via INTRAVENOUS
  Administered 2018-12-13: 100 ug via INTRAVENOUS

## 2018-12-13 MED ORDER — FENTANYL CITRATE (PF) 100 MCG/2ML IJ SOLN
INTRAMUSCULAR | Status: DC | PRN
  Administered 2018-12-13 (×2): 50 ug via INTRAVENOUS

## 2018-12-13 MED ORDER — LIDOCAINE HCL (CARDIAC) PF 100 MG/5ML IV SOSY
PREFILLED_SYRINGE | INTRAVENOUS | Status: DC | PRN
  Administered 2018-12-13: 100 mg via INTRAVENOUS

## 2018-12-13 MED ORDER — OXYCODONE-ACETAMINOPHEN 5-325 MG PO TABS: 1.0000 | ORAL_TABLET | Freq: Four times a day (QID) | ORAL | 0 refills | Status: AC | PRN

## 2018-12-13 MED ORDER — BUPIVACAINE LIPOSOME 1.3 % IJ SUSP
INTRAMUSCULAR | Status: DC | PRN
  Administered 2018-12-13 (×2): 10 mL

## 2018-12-13 MED ORDER — PROPOFOL 10 MG/ML IV BOLUS
INTRAVENOUS | Status: DC | PRN
  Administered 2018-12-13: 130 mg via INTRAVENOUS

## 2018-12-13 MED ORDER — GLYCOPYRROLATE 0.2 MG/ML IJ SOLN
INTRAMUSCULAR | Status: DC | PRN
  Administered 2018-12-13: 0.2 mg via INTRAVENOUS

## 2018-12-13 MED ORDER — FENTANYL CITRATE (PF) 100 MCG/2ML IJ SOLN
INTRAMUSCULAR | Status: AC
  Filled 2018-12-13: qty 2

## 2018-12-13 MED ORDER — ALBUTEROL SULFATE HFA 108 (90 BASE) MCG/ACT IN AERS
INHALATION_SPRAY | RESPIRATORY_TRACT | Status: DC | PRN
  Administered 2018-12-13: 4 via RESPIRATORY_TRACT

## 2018-12-13 MED ORDER — ONDANSETRON HCL 4 MG/2ML IJ SOLN: 4.0000 mg | Freq: Once | INTRAMUSCULAR | Status: DC | PRN

## 2018-12-13 MED ORDER — FENTANYL CITRATE (PF) 100 MCG/2ML IJ SOLN: 25.0000 ug | INTRAMUSCULAR | Status: DC | PRN

## 2018-12-13 MED ORDER — DEXAMETHASONE SODIUM PHOSPHATE 4 MG/ML IJ SOLN
INTRAMUSCULAR | Status: DC | PRN
  Administered 2018-12-13: 5 mg via INTRAVENOUS

## 2018-12-13 MED ORDER — PROPOFOL 10 MG/ML IV BOLUS
INTRAVENOUS | Status: AC
  Filled 2018-12-13: qty 20

## 2018-12-13 SURGICAL SUPPLY — 37 items
"PENCIL ELECTRO HAND CTR " (MISCELLANEOUS) ×1 IMPLANT
BNDG CONFORM 2 STRL LF (GAUZE/BANDAGES/DRESSINGS) ×2 IMPLANT
BNDG CONFORM 3 STRL LF (GAUZE/BANDAGES/DRESSINGS) ×2 IMPLANT
BNDG ELASTIC 4X5.8 VLCR NS LF (GAUZE/BANDAGES/DRESSINGS) ×2 IMPLANT
BNDG ESMARK 4X12 TAN STRL LF (GAUZE/BANDAGES/DRESSINGS) ×2 IMPLANT
BNDG GAUZE 4.5X4.1 6PLY STRL (MISCELLANEOUS) ×2 IMPLANT
CANISTER SUCT 1200ML W/VALVE (MISCELLANEOUS) ×2 IMPLANT
COVER WAND RF STERILE (DRAPES) ×2 IMPLANT
CUFF TOURN SGL QUICK 18X4 (TOURNIQUET CUFF) ×1 IMPLANT
DURAPREP 26ML APPLICATOR (WOUND CARE) ×2 IMPLANT
ELECT REM PT RETURN 9FT ADLT (ELECTROSURGICAL) ×2
ELECTRODE REM PT RTRN 9FT ADLT (ELECTROSURGICAL) ×1 IMPLANT
GAUZE SPONGE 4X4 12PLY STRL (GAUZE/BANDAGES/DRESSINGS) ×2 IMPLANT
GAUZE XEROFORM 1X8 LF (GAUZE/BANDAGES/DRESSINGS) ×2 IMPLANT
GLOVE BIO SURGEON STRL SZ7.5 (GLOVE) ×2 IMPLANT
GLOVE INDICATOR 8.0 STRL GRN (GLOVE) ×2 IMPLANT
GOWN STRL REUS W/ TWL LRG LVL3 (GOWN DISPOSABLE) ×2 IMPLANT
GOWN STRL REUS W/TWL LRG LVL3 (GOWN DISPOSABLE) ×2
KIT CARPAL TUNNEL (MISCELLANEOUS) ×1
KIT PRC PRB RTRGD 3ANG KNF HND (MISCELLANEOUS) IMPLANT
LABEL OR SOLS (LABEL) ×1 IMPLANT
LOOP VESSEL MINI 0.8X406 BLUE (MISCELLANEOUS) ×2 IMPLANT
LOOPS BLUE MINI 0.8X406MM (MISCELLANEOUS)
NDL HYPO 25X1 1.5 SAFETY (NEEDLE) ×1 IMPLANT
NEEDLE HYPO 25X1 1.5 SAFETY (NEEDLE) ×2 IMPLANT
NS IRRIG 500ML POUR BTL (IV SOLUTION) ×2 IMPLANT
PACK EXTREMITY ARMC (MISCELLANEOUS) ×2 IMPLANT
PAD PREP 24X41 OB/GYN DISP (PERSONAL CARE ITEMS) ×2 IMPLANT
PENCIL ELECTRO HAND CTR (MISCELLANEOUS) ×2 IMPLANT
STOCKINETTE M/LG 89821 (MISCELLANEOUS) ×2 IMPLANT
STRAP SAFETY 5IN WIDE (MISCELLANEOUS) ×2 IMPLANT
SUT ETHILON 4-0 (SUTURE) ×1
SUT ETHILON 4-0 FS2 18XMFL BLK (SUTURE) ×1
SUTURE ETHLN 4-0 FS2 18XMF BLK (SUTURE) IMPLANT
SYR 10ML LL (SYRINGE) ×2 IMPLANT
WAND TOPAZ MICRO DEBRIDER (MISCELLANEOUS) ×1 IMPLANT
WIRE Z .062 C-WIRE SPADE TIP (WIRE) ×1 IMPLANT

## 2018-12-13 NOTE — Anesthesia Procedure Notes (Signed)
Procedure Name: LMA Insertion Date/Time: 12/13/2018 12:09 PM Performed by: Bernardo Heater, CRNA Pre-anesthesia Checklist: Patient identified, Patient being monitored, Timeout performed, Emergency Drugs available and Suction available Patient Re-evaluated:Patient Re-evaluated prior to induction Oxygen Delivery Method: Circle system utilized Preoxygenation: Pre-oxygenation with 100% oxygen Induction Type: IV induction Ventilation: Mask ventilation without difficulty LMA: LMA inserted LMA Size: 4.0 Tube type: Oral Number of attempts: 1 Placement Confirmation: positive ETCO2 and breath sounds checked- equal and bilateral Tube secured with: Tape Dental Injury: Teeth and Oropharynx as per pre-operative assessment

## 2018-12-13 NOTE — Op Note (Signed)
Operative note   Surgeon:Lacey Dotson Lawyer: None    Preop diagnosis: 1.  Plantar fasciitis left heel 2.  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  Endoscopic plantar fasciotomy left foot 2.  Open Baxters nerve release distal tarsal tunnel left medial heel    EBL: 5 mL    Anesthesia:local and general.  Local consisted of a one-to-one mixture of 0.25% bupivacaine with epinephrine 1-200,000 and Exparel long-acting anesthetic preoperatively.  The incision site was then infiltrated with 10 mL of Exparel at the end of the case.    Hemostasis: Mid calf tourniquet inflated to 200 mmHg for 37 minutes    Specimen: None    Complications: None    Operative indications:Patricia Hoffman is an 48 y.o. that presents today for surgical intervention.  The risks/benefits/alternatives/complications have been discussed and consent has been given.    Procedure:  Patient was brought into the OR and placed on the operating table in thesupine position. After anesthesia was obtained theleft lower extremity was prepped and draped in usual sterile fashion.  Attention was directed to the medial aspect of the left heel where a small stab incision was performed.  The plantar fascial ligament was then bluntly dissected with hemostat and fascial elevator.  Next the blunt trocar and cannula was then introduced from medial to lateral.  A small stab incision was made lateral.  The small scope was placed through the cannula.  The plantar fascia was visualized.  Next the medial one half of the plantar fascia was incised.  The underlying muscle belly deep to the area was visualized.  The wound was then flushed with copious amounts of irrigation.  The blunt trocar and cannula was then removed.  Attention was then directed to the medial aspect of the heel where the medial incision was placed and brought more proximal towards the tarsal tunnel region.  Sharp and blunt  dissection carried down to the superficial fascial layer.  This was then incised.  The muscle belly at this time was then retracted both dorsal and plantar exposing the deep fascial layer.  This was then incised.  The small amount of subcutaneous adipose tissue was noted course along the Baxters nerve region and this was freed.  This was bluntly probed and no further tight attachments were found within the area.  The wound was flushed with copious amounts of irrigation.  The subcutaneous tissue was reapproximated with a 4-0 Vicryl and the skin reapproximated both medial and lateral with a 4-0 nylon.  Finally a small gridlike pattern was performed with a 0.062 K wire to the plantar left heel along the plantar fascial insertion.  This was then infiltrated with a Topaz wand in a percutaneous fashion.  A bulky sterile dressing was applied to the left foot and ankle.  Prescription for Percocet was sent to her pharmacy.    Patient tolerated the procedure and anesthesia well.  Was transported from the OR to the PACU with all vital signs stable and vascular status intact. To be discharged per routine protocol.  Will follow up in approximately 1 week in the outpatient clinic.

## 2018-12-13 NOTE — Transfer of Care (Signed)
Immediate Anesthesia Transfer of Care Note  Patient: Patricia Hoffman  Procedure(s) Performed: ENDOSCOPIC PLANTAR FASCIA RELEASE (Left ) BAXTER'S RELEASE (Left )  Patient Location: PACU  Anesthesia Type:General  Level of Consciousness: awake, alert  and oriented  Airway & Oxygen Therapy: Patient Spontanous Breathing and Patient connected to nasal cannula oxygen  Post-op Assessment: Report given to RN and Post -op Vital signs reviewed and stable  Post vital signs: Reviewed and stable  Last Vitals:  Vitals Value Taken Time  BP 105/66 12/13/18 1321  Temp    Pulse 88 12/13/18 1324  Resp 27 12/13/18 1324  SpO2 100 % 12/13/18 1324  Vitals shown include unvalidated device data.  Last Pain:  Vitals:   12/13/18 1002  TempSrc: Temporal  PainSc: 0-No pain         Complications: No apparent anesthesia complications

## 2018-12-13 NOTE — Discharge Instructions (Signed)
[facility]  POST OPERATIVE INSTRUCTIONS FOR DR. TROXLER, DR. Shivaan Tierno, AND DR. [facility] PODIATRY DEPARTMENT   1. Take your medication as prescribed.  Pain medication should be taken only as needed.  2. Keep the dressing clean, dry and intact.  3. Keep your foot elevated above the heart level for the first 48 hours.  4. Walking to the bathroom and brief periods of walking are acceptable, unless we have instructed you to be non-weight bearing.  5. Always wear your post-op shoe when walking.  Always use your crutches if you are to be non-weight bearing.  6. Do not take a shower. Baths are permissible as long as the foot is kept out of the water.   7. Every hour you are awake:  - Bend your knee 15 times. - Flex foot 15 times - Massage calf 15 times  8. Call [facility] ([phone]) if any of the following problems occur: - You develop a temperature or fever. - The bandage becomes saturated with blood. - Medication does not stop your pain. - Injury of the foot occurs. - Any symptoms of infection including redness, odor, or red streaks running from wound.  

## 2018-12-13 NOTE — Anesthesia Preprocedure Evaluation (Addendum)
Anesthesia Evaluation  Patient identified by MRN, date of birth, ID band Patient awake    Reviewed: Allergy & Precautions, NPO status , Patient's Chart, lab work & pertinent test results  History of Anesthesia Complications Negative for: history of anesthetic complications  Airway Mallampati: II       Dental  (+) Partial Upper   Pulmonary neg sleep apnea, neg COPD, Current Smoker and Patient abstained from smoking.,           Cardiovascular (-) hypertension(-) Past MI and (-) CHF (-) dysrhythmias (-) Valvular Problems/Murmurs     Neuro/Psych neg Seizures    GI/Hepatic Neg liver ROS, GERD  Medicated and Controlled,  Endo/Other  neg diabetes  Renal/GU negative Renal ROS     Musculoskeletal   Abdominal   Peds  Hematology   Anesthesia Other Findings   Reproductive/Obstetrics                           Anesthesia Physical Anesthesia Plan  ASA: II  Anesthesia Plan: General   Post-op Pain Management:    Induction:   PONV Risk Score and Plan: 2  Airway Management Planned: LMA  Additional Equipment:   Intra-op Plan:   Post-operative Plan:   Informed Consent: I have reviewed the patients History and Physical, chart, labs and discussed the procedure including the risks, benefits and alternatives for the proposed anesthesia with the patient or authorized representative who has indicated his/her understanding and acceptance.       Plan Discussed with:   Anesthesia Plan Comments:         Anesthesia Quick Evaluation

## 2018-12-13 NOTE — Anesthesia Postprocedure Evaluation (Signed)
Anesthesia Post Note  Patient: Patricia Hoffman  Procedure(s) Performed: ENDOSCOPIC PLANTAR FASCIA RELEASE (Left ) BAXTER'S RELEASE (Left )  Patient location during evaluation: PACU Anesthesia Type: General Level of consciousness: awake and alert Pain management: pain level controlled Vital Signs Assessment: post-procedure vital signs reviewed and stable Respiratory status: spontaneous breathing and respiratory function stable Cardiovascular status: blood pressure returned to baseline and stable Anesthetic complications: no     Last Vitals:  Vitals:   12/13/18 1351 12/13/18 1406  BP: 118/84 121/82  Pulse: 83 77  Resp: 14 14  Temp:    SpO2: 99% 98%    Last Pain:  Vitals:   12/13/18 1345  TempSrc:   PainSc: 0-No pain                 KEPHART,WILLIAM K

## 2018-12-13 NOTE — Anesthesia Post-op Follow-up Note (Signed)
Anesthesia QCDR form completed.        

## 2018-12-13 NOTE — H&P (Signed)
HISTORY AND PHYSICAL INTERVAL NOTE:  12/13/2018  10:24 AM  Patricia Hoffman  has presented today for surgery, with the diagnosis of M72.2 PLANTAR FASCIITIS M25.572 ACUTE LEFT ANKLE PAIN.  The various methods of treatment have been discussed with the patient.  No guarantees were given.  After consideration of risks, benefits and other options for treatment, the patient has consented to surgery.  I have reviewed the patients' chart and labs.     A history and physical examination was performed in my office.  The patient was reexamined.  There have been no changes to this history and physical examination.  [name]

## 2018-12-14 ENCOUNTER — Encounter: Payer: Self-pay | Admitting: Podiatry

## 2020-05-10 ENCOUNTER — Other Ambulatory Visit: Payer: Self-pay | Admitting: Physician Assistant

## 2020-05-10 DIAGNOSIS — Z1231 Encounter for screening mammogram for malignant neoplasm of breast: Secondary | ICD-10-CM

## 2020-05-27 ENCOUNTER — Ambulatory Visit
Admission: RE | Admit: 2020-05-27 | Discharge: 2020-05-27 | Disposition: A | Discharge status: Home / Self Care | Payer: BC Managed Care – PPO | Source: Ambulatory Visit | Attending: Physician Assistant | Admitting: Physician Assistant

## 2020-05-27 ENCOUNTER — Other Ambulatory Visit: Payer: Self-pay

## 2020-05-27 DIAGNOSIS — Z1231 Encounter for screening mammogram for malignant neoplasm of breast: Secondary | ICD-10-CM | POA: Diagnosis present

## 2020-05-28 ENCOUNTER — Other Ambulatory Visit: Payer: Self-pay | Admitting: *Deleted

## 2020-05-28 ENCOUNTER — Inpatient Hospital Stay
Admission: RE | Admit: 2020-05-28 | Discharge: 2020-05-28 | Disposition: A | Discharge status: Home / Self Care | Payer: Self-pay | Source: Ambulatory Visit | Attending: *Deleted | Admitting: *Deleted

## 2020-05-28 DIAGNOSIS — Z1231 Encounter for screening mammogram for malignant neoplasm of breast: Secondary | ICD-10-CM

## 2020-06-02 ENCOUNTER — Other Ambulatory Visit: Payer: Self-pay | Admitting: Physician Assistant

## 2020-06-02 DIAGNOSIS — N6489 Other specified disorders of breast: Secondary | ICD-10-CM

## 2020-06-02 DIAGNOSIS — R928 Other abnormal and inconclusive findings on diagnostic imaging of breast: Secondary | ICD-10-CM

## 2020-06-04 ENCOUNTER — Other Ambulatory Visit: Payer: Self-pay

## 2020-06-04 ENCOUNTER — Ambulatory Visit
Admission: RE | Admit: 2020-06-04 | Discharge: 2020-06-04 | Disposition: A | Discharge status: Home / Self Care | Payer: BC Managed Care – PPO | Source: Ambulatory Visit | Attending: Physician Assistant | Admitting: Physician Assistant

## 2020-06-04 DIAGNOSIS — N6489 Other specified disorders of breast: Secondary | ICD-10-CM | POA: Diagnosis present

## 2020-06-04 DIAGNOSIS — R928 Other abnormal and inconclusive findings on diagnostic imaging of breast: Secondary | ICD-10-CM | POA: Insufficient documentation

## 2020-06-05 ENCOUNTER — Ambulatory Visit
Admission: EM | Admit: 2020-06-05 | Discharge: 2020-06-05 | Disposition: A | Discharge status: Home / Self Care | Payer: BC Managed Care – PPO | Attending: Physician Assistant | Admitting: Physician Assistant

## 2020-06-05 DIAGNOSIS — F17210 Nicotine dependence, cigarettes, uncomplicated: Secondary | ICD-10-CM | POA: Insufficient documentation

## 2020-06-05 DIAGNOSIS — J019 Acute sinusitis, unspecified: Secondary | ICD-10-CM | POA: Diagnosis present

## 2020-06-05 DIAGNOSIS — H00013 Hordeolum externum right eye, unspecified eyelid: Secondary | ICD-10-CM

## 2020-06-05 DIAGNOSIS — U071 COVID-19: Secondary | ICD-10-CM | POA: Insufficient documentation

## 2020-06-05 DIAGNOSIS — J42 Unspecified chronic bronchitis: Secondary | ICD-10-CM | POA: Diagnosis present

## 2020-06-05 DIAGNOSIS — R059 Cough, unspecified: Secondary | ICD-10-CM

## 2020-06-05 MED ORDER — AMOXICILLIN-POT CLAVULANATE 875-125 MG PO TABS: 1.0000 | ORAL_TABLET | Freq: Two times a day (BID) | ORAL | 0 refills | Status: AC

## 2020-06-05 MED ORDER — PREDNISONE 20 MG PO TABS: ORAL_TABLET | ORAL | 0 refills | Status: DC

## 2020-06-05 MED ORDER — PROMETHAZINE-DM 6.25-15 MG/5ML PO SYRP: 5.0000 mL | ORAL_SOLUTION | Freq: Four times a day (QID) | ORAL | 0 refills | Status: DC | PRN

## 2020-06-05 MED ORDER — ERYTHROMYCIN 5 MG/GM OP OINT: TOPICAL_OINTMENT | OPHTHALMIC | 0 refills | Status: AC

## 2020-06-05 NOTE — ED Provider Notes (Signed)
[facility]    CSN: [id number] Arrival date & time: 06/05/20  1207      History   Chief Complaint Chief Complaint  Patient presents with  . Cough  . Headache    HPI Patricia Hoffman is a 50 y.o. female presenting for 1 week history of headaches, fatigue, sinus pain, nasal congestion, cough, and postnasal drainage.  Patient also admits to body aches and low-grade fevers up to 100 degrees.  Patient exposed to COVID-19 2 weeks ago.  She has not vaccinated for COVID-19 and says that she does not plan to be.  She has been taking over-the-counter Allegra and says it has not helped symptoms.  Patient does have history of chronic bronchitis.  Patient is a current 1 pack/day smoker x20 years.  She has albuterol inhaler to use as needed.  Has been using her albuterol all inhaler recently.  Admits to some slightly increased shortness of breath recently.  No chest pain.  No abdominal pain, nausea/vomiting or diarrhea.  Patient denies any other complaints or concerns at this time.  HPI  Past Medical History:  Diagnosis Date  . Cancer [facility])    Patient denies having breast cancer.  Marland Kitchen GERD (gastroesophageal reflux disease)   . Headache     There are no problems to display for this patient.   Past Surgical History:  Procedure Laterality Date  . ABDOMINAL HYSTERECTOMY    . BREAST BIOPSY Right    both benign ?years per pt  . BREAST CYST ASPIRATION Left 2016   left fna-neg  . CHOLECYSTECTOMY    . NERVE REPAIR Left 12/13/2018   Procedure: BAXTER'S RELEASE;  Surgeon: Samara Deist, DPM;  Location: [facility] ORS;  Service: Podiatry;  Laterality: Left;  . PLANTAR FASCIA RELEASE Left 12/13/2018   Procedure: ENDOSCOPIC PLANTAR FASCIA RELEASE;  Surgeon: Samara Deist, DPM;  Location: [facility] ORS;  Service: Podiatry;  Laterality: Left;  . TONSILLECTOMY      OB History   No obstetric history on file.      Home Medications    Prior to Admission medications   Medication Sig Start Date  End Date Taking? Authorizing Provider  amitriptyline (ELAVIL) 50 MG tablet Take 50 mg by mouth at bedtime.   Yes [provider]  amoxicillin-clavulanate (AUGMENTIN) 875-125 MG tablet Take 1 tablet by mouth every 12 (twelve) hours for 5 days. 06/05/20 06/10/20 Yes Danton Clap, PA-C  omeprazole (PRILOSEC) 40 MG capsule Take 40 mg by mouth daily.   Yes [provider]  predniSONE (DELTASONE) 20 MG tablet Take 2 tabs PO daily x 5 days 06/05/20  Yes Danton Clap, PA-C  promethazine-dextromethorphan (PROMETHAZINE-DM) 6.25-15 MG/5ML syrup Take 5 mLs by mouth 4 (four) times daily as needed for cough. 06/05/20  Yes Laurene Footman B, PA-C  albuterol (PROVENTIL HFA;VENTOLIN HFA) 108 (90 Base) MCG/ACT inhaler Inhale 2 puffs into the lungs every 4 (four) hours as needed for wheezing. 10/17/17   Marylene Land, NP  diclofenac (VOLTAREN) 75 MG EC tablet Take 1 tablet (75 mg total) by mouth 2 (two) times daily as needed. Patient not taking: No sig reported 02/06/18   Coral Spikes, DO  FLUoxetine (PROZAC) 40 MG capsule Take 40 mg by mouth daily.    [provider]  fluticasone (FLONASE) 50 MCG/ACT nasal spray Place 2 sprays into both nostrils daily. 02/04/16   Frederich Cha, MD  propranolol (INDERAL) 10 MG tablet Take 10 mg by mouth 2 (two) times daily as needed.  [provider]    Family History Family History  Problem Relation Age of Onset  . Heart disease Father   . Breast cancer Neg Hx     Social History Social History   Tobacco Use  . Smoking status: Current Every Day Smoker    Packs/day: 1.00    Years: 20.00    Pack years: 20.00    Types: Cigarettes  . Smokeless tobacco: Never Used  Vaping Use  . Vaping Use: Never used  Substance Use Topics  . Alcohol use: No  . Drug use: No     Allergies   Pantoprazole   Review of Systems Review of Systems  Constitutional: Positive for fatigue and fever. Negative for chills and diaphoresis.  HENT: Positive  for congestion, rhinorrhea and sinus pressure. Negative for ear pain and sore throat.   Respiratory: Positive for cough and shortness of breath. Negative for wheezing.   Cardiovascular: Negative for chest pain.  Gastrointestinal: Negative for abdominal pain, nausea and vomiting.  Musculoskeletal: Positive for myalgias. Negative for arthralgias.  Skin: Negative for rash.  Neurological: Positive for headaches. Negative for weakness.  Hematological: Negative for adenopathy.     Physical Exam Triage Vital Signs ED Triage Vitals  Enc Vitals Group     BP 06/05/20 1229 126/89     Pulse Rate 06/05/20 1229 86     Resp 06/05/20 1229 14     Temp 06/05/20 1229 98.3 F (36.8 C)     Temp Source 06/05/20 1229 Oral     SpO2 06/05/20 1229 100 %     Weight 06/05/20 1225 168 lb (76.2 kg)     Height 06/05/20 1225 5\' 4"  (1.626 m)     Head Circumference --      Peak Flow --      Pain Score 06/05/20 1225 6     Pain Loc --      Pain Edu? --      Excl. in GC? --    No data found.  Updated Vital Signs BP 126/89 (BP Location: Left Arm)   Pulse 86   Temp 98.3 F (36.8 C) (Oral)   Resp 14   Ht 5\' 4"  (1.626 m)   Wt 168 lb (76.2 kg)   SpO2 100%   BMI 28.84 kg/m       Physical Exam Vitals and nursing note reviewed.  Constitutional:      General: She is not in acute distress.    Appearance: Normal appearance. She is not ill-appearing or toxic-appearing.  HENT:     Head: Normocephalic and atraumatic.     Right Ear: Tympanic membrane, ear canal and external ear normal.     Left Ear: Tympanic membrane, ear canal and external ear normal.     Nose: Congestion and rhinorrhea present.     Right Sinus: Maxillary sinus tenderness present.     Left Sinus: Maxillary sinus tenderness present.     Mouth/Throat:     Mouth: Mucous membranes are moist.     Pharynx: Oropharynx is clear. Posterior oropharyngeal erythema present.  Eyes:     General: No scleral icterus.       Right eye: No discharge.         Left eye: No discharge.     Conjunctiva/sclera: Conjunctivae normal.     Comments: 1 stye of right upper eyelid and one stye of the right lower eyelid  Cardiovascular:     Rate and Rhythm: Normal rate and regular rhythm.  Heart sounds: Normal heart sounds.  Pulmonary:     Effort: Pulmonary effort is normal. No respiratory distress.     Breath sounds: Normal breath sounds.  Musculoskeletal:     Cervical back: Neck supple.  Skin:    General: Skin is dry.  Neurological:     General: No focal deficit present.     Mental Status: She is alert. Mental status is at baseline.     Motor: No weakness.     Gait: Gait normal.  Psychiatric:        Mood and Affect: Mood normal.        Behavior: Behavior normal.        Thought Content: Thought content normal.      UC Treatments / Results  Labs (all labs ordered are listed, but only abnormal results are displayed) Labs Reviewed  SARS CORONAVIRUS 2 (TAT 6-24 HRS)    EKG   Radiology US BREAST LTD UNI LEFT INC AXILLA  Result Date: 06/04/2020 CLINICAL DATA:  Callback for LEFT breast focal asymmetry EXAM: DIGITAL DIAGNOSTIC UNILATERAL LEFT MAMMOGRAM WITH TOMOSYNTHESIS AND CAD; ULTRASOUND LEFT BREAST LIMITED TECHNIQUE: Left digital diagnostic mammography and breast tomosynthesis was performed. The images were evaluated with computer-aided detection.; Targeted ultrasound examination of the left breast was performed COMPARISON:  Previous exam(s). ACR Breast Density Category d: The breast tissue is extremely dense, which lowers the sensitivity of mammography. FINDINGS: Spot compression tomosynthesis views confirm persistence of an oval circumscribed mass at the site of screening mammographic concern. There are innumerable additional oval circumscribed masses, consistent with history of benign cysts. On physical exam, no suspicious mass is appreciated. Targeted ultrasound was performed of the outer breast. At the site of screening mammographic  concern at 3 o'clock 7 cm from the nipple, there is an oval circumscribed anechoic mass with posterior acoustic enhancement and thin internal septations without vascularity. It measures 6 by 2 x 5 mm. This is consistent with a benign cluster of cysts. Multiple additional simple and similar appearing clusters of cysts were noted during real-time examination. An additional benign cyst is documented at 3 o'clock 5 cm from the nipple which measures 1.1 cm. No suspicious cystic or solid mass is seen. IMPRESSION: There is a benign cyst at the site of screening mammographic concern. No mammographic or sonographic evidence of malignancy. RECOMMENDATION: Screening mammogram in one year.(Code:SM-B-01Y) I have discussed the findings and recommendations with the patient. If applicable, a reminder letter will be sent to the patient regarding the next appointment. BI-RADS CATEGORY  2: Benign. Electronically Signed   By: Valentino Saxon MD   On: 06/04/2020 15:15   MM DIAG BREAST TOMO UNI LEFT  Result Date: 06/04/2020 CLINICAL DATA:  Callback for LEFT breast focal asymmetry EXAM: DIGITAL DIAGNOSTIC UNILATERAL LEFT MAMMOGRAM WITH TOMOSYNTHESIS AND CAD; ULTRASOUND LEFT BREAST LIMITED TECHNIQUE: Left digital diagnostic mammography and breast tomosynthesis was performed. The images were evaluated with computer-aided detection.; Targeted ultrasound examination of the left breast was performed COMPARISON:  Previous exam(s). ACR Breast Density Category d: The breast tissue is extremely dense, which lowers the sensitivity of mammography. FINDINGS: Spot compression tomosynthesis views confirm persistence of an oval circumscribed mass at the site of screening mammographic concern. There are innumerable additional oval circumscribed masses, consistent with history of benign cysts. On physical exam, no suspicious mass is appreciated. Targeted ultrasound was performed of the outer breast. At the site of screening mammographic concern at  3 o'clock 7 cm from the nipple, there is an oval circumscribed  anechoic mass with posterior acoustic enhancement and thin internal septations without vascularity. It measures 6 by 2 x 5 mm. This is consistent with a benign cluster of cysts. Multiple additional simple and similar appearing clusters of cysts were noted during real-time examination. An additional benign cyst is documented at 3 o'clock 5 cm from the nipple which measures 1.1 cm. No suspicious cystic or solid mass is seen. IMPRESSION: There is a benign cyst at the site of screening mammographic concern. No mammographic or sonographic evidence of malignancy. RECOMMENDATION: Screening mammogram in one year.(Code:SM-B-01Y) I have discussed the findings and recommendations with the patient. If applicable, a reminder letter will be sent to the patient regarding the next appointment. BI-RADS CATEGORY  2: Benign. Electronically Signed   By: Valentino Saxon MD   On: 06/04/2020 15:15    Procedures Procedures (including critical care time)  Medications Ordered in UC Medications - No data to display  Initial Impression / Assessment and Plan / UC Course  I have reviewed the triage vital signs and the nursing notes.  Pertinent labs & imaging results that were available during my care of the patient were reviewed by me and considered in my medical decision making (see chart for details).   50 year old female presenting for sinus congestion, headaches, cough, facial pain, and shortness of breath x1 week.  States symptoms are worsening.  Possible COVID-19 exposure couple weeks ago.  Not vaccinated for COVID-19.  History of chronic bronchitis and continues to smoke.  Vital signs are all stable in the clinic.  Treating patient at this time for acute sinusitis especially given her history of chronic bronchitis.  Sent Augmentin and prednisone.  Encouraged her to continue using her albuterol inhaler as needed for any shortness of breath.  Sent  Promethazine DM for cough.  Encouraged increasing rest and fluids.  She does have styes of the right eye.  Sent erythromycin ointment.  Advised to follow-up as needed.  COVID test was obtained.  Current CDC guidelines, isolation protocol and ED precautions were reviewed.   Final Clinical Impressions(s) / UC Diagnoses   Final diagnoses:  Acute sinusitis, recurrence not specified, unspecified location  Chronic bronchitis, unspecified chronic bronchitis type ([place])  Cough     Discharge Instructions     You have received COVID testing today either for positive exposure, concerning symptoms that could be related to COVID infection, screening purposes, or re-testing after confirmed positive.  Your test obtained today checks for active viral infection in the last 1-2 weeks. If your test is negative now, you can still test positive later. So, if you do develop symptoms you should either get re-tested and/or isolate x 5 days and then strict mask use x 5 days (unvaccinated) or mask use x 10 days (vaccinated). Please follow CDC guidelines.  While Rapid antigen tests come back in 15-20 minutes, send out PCR/molecular test results typically come back within 1-3 days. In the mean time, if you are symptomatic, assume this could be a positive test and treat/monitor yourself as if you do have COVID.   We will call with test results if positive. Please download the MyChart app and set up a profile to access test results.   If symptomatic, go home and rest. Push fluids. Take Tylenol as needed for discomfort. Gargle warm salt water. Throat lozenges. Take Mucinex DM or Robitussin for cough. Humidifier in bedroom to ease coughing. Warm showers. Also review the COVID handout for more information.  COVID-19 INFECTION: The incubation period of COVID-19  is approximately 14 days after exposure, with most symptoms developing in roughly 4-5 days. Symptoms may range in severity from mild to critically severe. Roughly 80%  of those infected will have mild symptoms. People of any age may become infected with COVID-19 and have the ability to transmit the virus. The most common symptoms include: fever, fatigue, cough, body aches, headaches, sore throat, nasal congestion, shortness of breath, nausea, vomiting, diarrhea, changes in smell and/or taste.    COURSE OF ILLNESS Some patients may begin with mild disease which can progress quickly into critical symptoms. If your symptoms are worsening please call ahead to the Emergency Department and proceed there for further treatment. Recovery time appears to be roughly 1-2 weeks for mild symptoms and 3-6 weeks for severe disease.   GO IMMEDIATELY TO ER FOR FEVER YOU ARE UNABLE TO GET DOWN WITH TYLENOL, BREATHING PROBLEMS, CHEST PAIN, FATIGUE, LETHARGY, INABILITY TO EAT OR DRINK, ETC  QUARANTINE AND ISOLATION: To help decrease the spread of COVID-19 please remain isolated if you have COVID infection or are highly suspected to have COVID infection. This means -stay home and isolate to one room in the home if you live with others. Do not share a bed or bathroom with others while ill, sanitize and wipe down all countertops and keep common areas clean and disinfected. Stay home for 5 days. If you have no symptoms or your symptoms are resolving after 5 days, you can leave your house. Continue to wear a mask around others for 5 additional days. If you have been in close contact (within 6 feet) of someone diagnosed with COVID 19, you are advised to quarantine in your home for 14 days as symptoms can develop anywhere from 2-14 days after exposure to the virus. If you develop symptoms, you  must isolate.  Most current guidelines for COVID after exposure -unvaccinated: isolate 5 days and strict mask use x 5 days. Test on day 5 is possible -vaccinated: wear mask x 10 days if symptoms do not develop -You do not necessarily need to be tested for COVID if you have + exposure and  develop symptoms.  Just isolate at home x10 days from symptom onset During this global pandemic, CDC advises to practice social distancing, try to stay at least 2ft away from others at all times. Wear a face covering. Wash and sanitize your hands regularly and avoid going anywhere that is not necessary.  KEEP IN MIND THAT THE COVID TEST IS NOT 100% ACCURATE AND YOU SHOULD STILL DO EVERYTHING TO PREVENT POTENTIAL SPREAD OF VIRUS TO OTHERS (WEAR MASK, WEAR GLOVES, [place] HANDS AND SANITIZE REGULARLY). IF INITIAL TEST IS NEGATIVE, THIS MAY NOT MEAN YOU ARE DEFINITELY NEGATIVE. MOST ACCURATE TESTING IS DONE 5-7 DAYS AFTER EXPOSURE.   It is not advised by CDC to get re-tested after receiving a positive COVID test since you can still test positive for weeks to months after you have already cleared the virus.   *If you have not been vaccinated for COVID, I strongly suggest you consider getting vaccinated as long as there are no contraindications.      ED Prescriptions    Medication Sig Dispense Auth. Provider   amoxicillin-clavulanate (AUGMENTIN) 875-125 MG tablet Take 1 tablet by mouth every 12 (twelve) hours for 5 days. 10 tablet Laurene Footman B, PA-C   predniSONE (DELTASONE) 20 MG tablet Take 2 tabs PO daily x 5 days 10 tablet Danton Clap, PA-C   promethazine-dextromethorphan (PROMETHAZINE-DM) 6.25-15 MG/5ML syrup Take 5  mLs by mouth 4 (four) times daily as needed for cough. 118 mL Danton Clap, PA-C     PDMP not reviewed this encounter.   Danton Clap, PA-C 06/05/20 1306

## 2020-06-05 NOTE — ED Triage Notes (Signed)
Patient c/o sinus pressure, headache, cough, bodyaches, and low grade fever since Monday.

## 2020-06-05 NOTE — Discharge Instructions (Signed)

## 2020-06-06 LAB — SARS CORONAVIRUS 2 (TAT 6-24 HRS): SARS Coronavirus 2: POSITIVE — AB

## 2020-06-07 ENCOUNTER — Telehealth: Payer: Self-pay | Admitting: Nurse Practitioner

## 2020-06-07 ENCOUNTER — Telehealth: Payer: Self-pay

## 2020-06-07 NOTE — Telephone Encounter (Signed)
Called to Discuss with patient about Covid symptoms and the use of the monoclonal antibody/antiviral infusion/oral therapies for those with mild to moderate Covid symptoms and at a high risk of hospitalization.     Of note patient had ER visit on 4/30. At that time symptoms had been ongoing for more than a week. .  Unable to reach pt Voicemail left and My Chart message sent.   Symptom onset: >1 week Vaccinated: No Booster: No  Immunocompromised: No  Qualifiers: Chronic Bronchitis/smoker  Alda Lea, NP [facility]  [phone]

## 2020-06-07 NOTE — Telephone Encounter (Signed)
Called to discuss with patient about COVID-19 symptoms and the use of one of the available treatments for those with mild to moderate Covid symptoms and at a high risk of hospitalization.  Pt appears to qualify for outpatient treatment due to co-morbid conditions and/or a member of an at-risk group in accordance with the FDA Emergency Use Authorization.    Symptom onset: 05/29/20 Vaccinated: No Booster? No Immunocompromised? No Qualifiers: Chronic bronchitis NIH Criteria:   Unable to reach pt - Left message and call back number [phone].   Patricia Hoffman

## 2020-09-09 ENCOUNTER — Encounter: Payer: Self-pay | Admitting: *Deleted

## 2020-09-10 ENCOUNTER — Ambulatory Visit: Admit: 2020-09-10 | Payer: BC Managed Care – PPO

## 2020-09-10 ENCOUNTER — Other Ambulatory Visit: Payer: Self-pay

## 2020-09-10 ENCOUNTER — Ambulatory Visit: Payer: BC Managed Care – PPO | Admitting: Anesthesiology

## 2020-09-10 ENCOUNTER — Ambulatory Visit
Admission: RE | Admit: 2020-09-10 | Discharge: 2020-09-10 | Disposition: A | Discharge status: Home / Self Care | Payer: BC Managed Care – PPO | Attending: Gastroenterology | Admitting: Gastroenterology

## 2020-09-10 ENCOUNTER — Encounter
Admission: RE | Disposition: A | Discharge status: Home / Self Care | Payer: Self-pay | Source: Home / Self Care | Attending: Gastroenterology

## 2020-09-10 ENCOUNTER — Encounter: Payer: Self-pay | Admitting: *Deleted

## 2020-09-10 DIAGNOSIS — K2100 Gastro-esophageal reflux disease with esophagitis, without bleeding: Secondary | ICD-10-CM | POA: Diagnosis not present

## 2020-09-10 DIAGNOSIS — Z888 Allergy status to other drugs, medicaments and biological substances status: Secondary | ICD-10-CM | POA: Diagnosis not present

## 2020-09-10 DIAGNOSIS — Z79899 Other long term (current) drug therapy: Secondary | ICD-10-CM | POA: Diagnosis not present

## 2020-09-10 DIAGNOSIS — Z1211 Encounter for screening for malignant neoplasm of colon: Secondary | ICD-10-CM | POA: Diagnosis not present

## 2020-09-10 DIAGNOSIS — R1310 Dysphagia, unspecified: Secondary | ICD-10-CM | POA: Diagnosis not present

## 2020-09-10 DIAGNOSIS — K635 Polyp of colon: Secondary | ICD-10-CM | POA: Diagnosis not present

## 2020-09-10 DIAGNOSIS — Z8371 Family history of colonic polyps: Secondary | ICD-10-CM | POA: Diagnosis not present

## 2020-09-10 DIAGNOSIS — K449 Diaphragmatic hernia without obstruction or gangrene: Secondary | ICD-10-CM | POA: Diagnosis not present

## 2020-09-10 HISTORY — DX: Diaphragmatic hernia without obstruction or gangrene: K44.9

## 2020-09-10 HISTORY — PX: ESOPHAGOGASTRODUODENOSCOPY (EGD) WITH PROPOFOL: SHX5813

## 2020-09-10 HISTORY — DX: Depression, unspecified: F32.A

## 2020-09-10 HISTORY — DX: Irritable bowel syndrome, unspecified: K58.9

## 2020-09-10 HISTORY — PX: COLONOSCOPY WITH PROPOFOL: SHX5780

## 2020-09-10 SURGERY — EGD (ESOPHAGOGASTRODUODENOSCOPY)
Anesthesia: General

## 2020-09-10 SURGERY — ESOPHAGOGASTRODUODENOSCOPY (EGD) WITH PROPOFOL
Anesthesia: General

## 2020-09-10 MED ORDER — PROPOFOL 500 MG/50ML IV EMUL
INTRAVENOUS | Status: AC
  Filled 2020-09-10: qty 50

## 2020-09-10 MED ORDER — LIDOCAINE HCL (CARDIAC) PF 100 MG/5ML IV SOSY
PREFILLED_SYRINGE | INTRAVENOUS | Status: DC | PRN
  Administered 2020-09-10: 30 mg via INTRAVENOUS

## 2020-09-10 MED ORDER — PROPOFOL 500 MG/50ML IV EMUL
INTRAVENOUS | Status: DC | PRN
  Administered 2020-09-10: 160 ug/kg/min via INTRAVENOUS

## 2020-09-10 MED ORDER — PROPOFOL 10 MG/ML IV BOLUS
INTRAVENOUS | Status: DC | PRN
  Administered 2020-09-10: 100 mg via INTRAVENOUS
  Administered 2020-09-10: 20 mg via INTRAVENOUS

## 2020-09-10 MED ORDER — SODIUM CHLORIDE 0.9 % IV SOLN: INTRAVENOUS | Status: DC

## 2020-09-10 NOTE — Interval H&P Note (Signed)
History and Physical Interval Note:  09/10/2020 12:48 PM  Patricia Hoffman  has presented today for surgery, with the diagnosis of Family history of polyps in the colon (Z83.71) Gastroesophageal reflux disease, unspecified whether esophagitis present (K21.9).  The various methods of treatment have been discussed with the patient and family. After consideration of risks, benefits and other options for treatment, the patient has consented to  Procedure(s): ESOPHAGOGASTRODUODENOSCOPY (EGD) WITH PROPOFOL (N/A) COLONOSCOPY WITH PROPOFOL (N/A) as a surgical intervention.  The patient's history has been reviewed, patient examined, no change in status, stable for surgery.  I have reviewed the patient's chart and labs.  Questions were answered to the patient's satisfaction.     Lesly Rubenstein  Ok to proceed with EGD/Colonoscopy

## 2020-09-10 NOTE — Anesthesia Preprocedure Evaluation (Signed)
Anesthesia Evaluation  Patient identified by MRN, date of birth, ID band Patient awake    Reviewed: Allergy & Precautions, NPO status , Patient's Chart, lab work & pertinent test results  History of Anesthesia Complications Negative for: history of anesthetic complications  Airway Mallampati: II  TM Distance: >3 FB Neck ROM: Full    Dental  (+) Partial Upper, Poor Dentition   Pulmonary neg sleep apnea, COPD,  COPD inhaler, Current SmokerPatient did not abstain from smoking.,    Pulmonary exam normal breath sounds clear to auscultation       Cardiovascular Exercise Tolerance: Good METS(-) hypertension(-) CAD and (-) Past MI negative cardio ROS  (-) dysrhythmias  Rhythm:Regular Rate:Normal - Systolic murmurs    Neuro/Psych  Headaches, PSYCHIATRIC DISORDERS Depression    GI/Hepatic hiatal hernia, GERD  Medicated,(+)     (-) substance abuse  ,   Endo/Other  neg diabetes  Renal/GU negative Renal ROS     Musculoskeletal   Abdominal   Peds  Hematology   Anesthesia Other Findings Past Medical History: No date: Cancer [facility])     Comment:  Patient denies having breast cancer. No date: Depression No date: GERD (gastroesophageal reflux disease) No date: Headache No date: Hiatal hernia No date: IBS (irritable bowel syndrome)  Reproductive/Obstetrics                             Anesthesia Physical Anesthesia Plan  ASA: 2  Anesthesia Plan: General   Post-op Pain Management:    Induction: Intravenous  PONV Risk Score and Plan: 2 and Ondansetron, Propofol infusion and TIVA  Airway Management Planned: Nasal Cannula  Additional Equipment: None  Intra-op Plan:   Post-operative Plan:   Informed Consent: I have reviewed the patients History and Physical, chart, labs and discussed the procedure including the risks, benefits and alternatives for the proposed anesthesia with the patient or  authorized representative who has indicated his/her understanding and acceptance.     Dental advisory given  Plan Discussed with: CRNA and Surgeon  Anesthesia Plan Comments: (Discussed risks of anesthesia with patient, including possibility of difficulty with spontaneous ventilation under anesthesia necessitating airway intervention, PONV, and rare risks such as cardiac or respiratory or neurological events, and allergic reactions. Patient understands. Patient counseled on benefits of smoking cessation, and increased perioperative risks associated with continued smoking. )        Anesthesia Quick Evaluation

## 2020-09-10 NOTE — Transfer of Care (Signed)
Immediate Anesthesia Transfer of Care Note  Patient: Patricia Hoffman  Procedure(s) Performed: ESOPHAGOGASTRODUODENOSCOPY (EGD) WITH PROPOFOL COLONOSCOPY WITH PROPOFOL  Patient Location: PACU and Endoscopy Unit  Anesthesia Type:General  Level of Consciousness: awake  Airway & Oxygen Therapy: Patient Spontanous Breathing  Post-op Assessment: Report given to RN  Post vital signs: stable  Last Vitals:  Vitals Value Taken Time  BP 102/68 09/10/20 1335  Temp    Pulse 83 09/10/20 1336  Resp 27 09/10/20 1336  SpO2 96 % 09/10/20 1336  Vitals shown include unvalidated device data.  Last Pain:  Vitals:   09/10/20 1334  TempSrc: Temporal  PainSc: Asleep         Complications: No notable events documented.

## 2020-09-10 NOTE — Op Note (Signed)
[facility] Patient Name: Patricia Hoffman Procedure Date: 09/10/2020 11:58 AM MRN: [id number] Account #: [id number] Date of Birth: 1970-10-12 Admit Type: Outpatient Age: 50 Room: [facility] ENDO ROOM 3 Gender: Female Note Status: Finalized Procedure:             Upper GI endoscopy Indications:           Dysphagia, Gastro-esophageal reflux disease Providers:             Andrey Farmer MD, MD Medicines:             Monitored Anesthesia Care Complications:         No immediate complications. Estimated blood loss:                         Minimal. Procedure:             Pre-Anesthesia Assessment:                        - Prior to the procedure, a History and Physical was                         performed, and patient medications and allergies were                         reviewed. The patient is competent. The risks and                         benefits of the procedure and the sedation options and                         risks were discussed with the patient. All questions                         were answered and informed consent was obtained.                         Patient identification and proposed procedure were                         verified by the physician, the nurse, the anesthetist                         and the technician in the endoscopy suite. Mental                         Status Examination: alert and oriented. Airway                         Examination: normal oropharyngeal airway and neck                         mobility. Respiratory Examination: clear to                         auscultation. CV Examination: normal. Prophylactic                         Antibiotics: The patient does not require prophylactic  antibiotics. Prior Anticoagulants: The patient has                         taken no previous anticoagulant or antiplatelet                         agents. ASA Grade Assessment: II - A patient with mild                          systemic disease. After reviewing the risks and                         benefits, the patient was deemed in satisfactory                         condition to undergo the procedure. The anesthesia                         plan was to use monitored anesthesia care (MAC).                         Immediately prior to administration of medications,                         the patient was re-assessed for adequacy to receive                         sedatives. The heart rate, respiratory rate, oxygen                         saturations, blood pressure, adequacy of pulmonary                         ventilation, and response to care were monitored                         throughout the procedure. The physical status of the                         patient was re-assessed after the procedure.                        After obtaining informed consent, the endoscope was                         passed under direct vision. Throughout the procedure,                         the patient's blood pressure, pulse, and oxygen                         saturations were monitored continuously. The Endoscope                         was introduced through the mouth, and advanced to the                         second part of duodenum. The upper GI endoscopy was  accomplished without difficulty. The patient tolerated                         the procedure well. Findings:      A small hiatal hernia was present.      LA Grade A (one or more mucosal breaks less than 5 mm, not extending       between tops of 2 mucosal folds) esophagitis with no bleeding was found.       Biopsies were obtained from the proximal and distal esophagus with cold       forceps for histology of suspected eosinophilic esophagitis. Estimated       blood loss was minimal.      The exam of the esophagus was otherwise normal.      The entire examined stomach was normal.      The examined duodenum was normal. Impression:             - Small hiatal hernia.                        - LA Grade A reflux esophagitis with no bleeding.                         Biopsied.                        - Normal stomach.                        - Normal examined duodenum. Recommendation:        - Perform a colonoscopy today. Procedure Code(s):     --- Professional ---                        [phone], Esophagogastroduodenoscopy, flexible,                         transoral; with biopsy, single or multiple Diagnosis Code(s):     --- Professional ---                        K44.9, Diaphragmatic hernia without obstruction or                         gangrene                        K21.00, Gastro-esophageal reflux disease with                         esophagitis, without bleeding                        R13.10, Dysphagia, unspecified CPT copyright 2019 American [employer]. All rights reserved. The codes documented in this report are preliminary and upon coder review may  be revised to meet current compliance requirements. Andrey Farmer MD, MD 09/10/2020 1:31:18 PM Number of Addenda: 0 Note Initiated On: 09/10/2020 11:58 AM Estimated Blood Loss:  Estimated blood loss was minimal.      [facility]

## 2020-09-10 NOTE — Anesthesia Postprocedure Evaluation (Signed)
Anesthesia Post Note  Patient: Clarrissa Rhene Nault  Procedure(s) Performed: ESOPHAGOGASTRODUODENOSCOPY (EGD) WITH PROPOFOL COLONOSCOPY WITH PROPOFOL  Patient location during evaluation: Endoscopy Anesthesia Type: General Level of consciousness: awake and alert Pain management: pain level controlled Vital Signs Assessment: post-procedure vital signs reviewed and stable Respiratory status: spontaneous breathing, nonlabored ventilation, respiratory function stable and patient connected to nasal cannula oxygen Cardiovascular status: blood pressure returned to baseline and stable Postop Assessment: no apparent nausea or vomiting Anesthetic complications: no   No notable events documented.   Last Vitals:  Vitals:   09/10/20 1334 09/10/20 1344  BP: 102/68 105/73  Pulse: 84 79  Resp: 16 (!) 22  Temp: 36.9 C   SpO2: 98% 97%    Last Pain:  Vitals:   09/10/20 1344  TempSrc:   PainSc: 0-No pain                 Arita Miss

## 2020-09-10 NOTE — Op Note (Signed)
[facility] Patient Name: Patricia Hoffman Procedure Date: 09/10/2020 11:57 AM MRN: [id number] Account #: [id number] Date of Birth: November 16, 1970 Admit Type: Outpatient Age: 50 Room: [facility] ENDO ROOM 3 Gender: Female Note Status: Finalized Procedure:             Colonoscopy Indications:           Colon cancer screening in patient at increased risk:                         Family history of 1st-degree relative with colon polyps Providers:             Andrey Farmer MD, MD Medicines:             Monitored Anesthesia Care Complications:         No immediate complications. Estimated blood loss:                         Minimal. Procedure:             Pre-Anesthesia Assessment:                        - Prior to the procedure, a History and Physical was                         performed, and patient medications and allergies were                         reviewed. The patient is competent. The risks and                         benefits of the procedure and the sedation options and                         risks were discussed with the patient. All questions                         were answered and informed consent was obtained.                         Patient identification and proposed procedure were                         verified by the physician, the nurse, the anesthetist                         and the technician in the endoscopy suite. Mental                         Status Examination: alert and oriented. Airway                         Examination: normal oropharyngeal airway and neck                         mobility. Respiratory Examination: clear to                         auscultation. CV Examination: normal. Prophylactic  Antibiotics: The patient does not require prophylactic                         antibiotics. Prior Anticoagulants: The patient has                         taken no previous anticoagulant or antiplatelet                          agents. ASA Grade Assessment: II - A patient with mild                         systemic disease. After reviewing the risks and                         benefits, the patient was deemed in satisfactory                         condition to undergo the procedure. The anesthesia                         plan was to use monitored anesthesia care (MAC).                         Immediately prior to administration of medications,                         the patient was re-assessed for adequacy to receive                         sedatives. The heart rate, respiratory rate, oxygen                         saturations, blood pressure, adequacy of pulmonary                         ventilation, and response to care were monitored                         throughout the procedure. The physical status of the                         patient was re-assessed after the procedure.                        After obtaining informed consent, the colonoscope was                         passed under direct vision. Throughout the procedure,                         the patient's blood pressure, pulse, and oxygen                         saturations were monitored continuously. The                         Colonoscope was introduced through the anus and  advanced to the the cecum, identified by appendiceal                         orifice and ileocecal valve. The colonoscopy was                         performed without difficulty. The patient tolerated                         the procedure well. The quality of the bowel                         preparation was adequate to identify polyps. Findings:      The perianal and digital rectal examinations were normal.      A 4 mm polyp was found in the hepatic flexure. The polyp was sessile.       The polyp was removed with a cold snare. Resection and retrieval were       complete. Estimated blood loss was minimal.      Two sessile polyps were  found in the transverse colon. The polyps were 2       to 3 mm in size. These polyps were removed with a cold snare. Resection       and retrieval were complete. Estimated blood loss was minimal.      A 2 mm polyp was found in the descending colon. The polyp was sessile.       The polyp was removed with a cold snare. Resection and retrieval were       complete. Estimated blood loss was minimal.      The exam was otherwise without abnormality on direct and retroflexion       views. Impression:            - One 4 mm polyp at the hepatic flexure, removed with                         a cold snare. Resected and retrieved.                        - Two 2 to 3 mm polyps in the transverse colon,                         removed with a cold snare. Resected and retrieved.                        - One 2 mm polyp in the descending colon, removed with                         a cold snare. Resected and retrieved.                        - The examination was otherwise normal on direct and                         retroflexion views. Recommendation:        - Discharge patient to home.                        - Resume previous diet.                        -  Continue present medications.                        - Await pathology results.                        - Repeat colonoscopy for surveillance based on                         pathology results.                        - Return to referring physician as previously                         scheduled. Procedure Code(s):     --- Professional ---                        [id number], Colonoscopy, flexible; with removal of                         tumor(s), polyp(s), or other lesion(s) by snare                         technique Diagnosis Code(s):     --- Professional ---                        K63.5, Polyp of colon                        Z83.71, Family history of colonic polyps CPT copyright 2019 American Medical Association. All rights reserved. The codes documented in  this report are preliminary and upon coder review may  be revised to meet current compliance requirements. Andrey Farmer MD, MD 09/10/2020 1:35:18 PM Number of Addenda: 0 Note Initiated On: 09/10/2020 11:57 AM Scope Withdrawal Time: 0 hours 10 minutes 27 seconds  Total Procedure Duration: 0 hours 19 minutes 22 seconds  Estimated Blood Loss:  Estimated blood loss was minimal.      [facility]

## 2020-09-10 NOTE — H&P (Signed)
Outpatient short stay form Pre-procedure 09/10/2020 12:45 PM Raylene Miyamoto MD, MPH  Primary Physician: [place]  Reason for visit:  Dysphagia/family history of polyps  History of present illness:    50 y/o lady with history of IBS here for EGD/Colonoscopy for dysphagia and family history of polyps. Has had severe endoscopies in the past with unremarkable results. No blood thinners. No family history of GI malignancies. History of hysterectomy and cholecystectomy.    Current Facility-Administered Medications:    0.9 %  sodium chloride infusion, , Intravenous, Continuous, Hamp Moreland, Hilton Cork, MD, Last Rate: 20 mL/hr at 09/10/20 1224, New Bag at 09/10/20 1224  Medications Prior to Admission  Medication Sig Dispense Refill Last Dose   amitriptyline (ELAVIL) 50 MG tablet Take 50 mg by mouth at bedtime.   Past Week   FLUoxetine (PROZAC) 40 MG capsule Take 40 mg by mouth daily.   09/09/2020   omeprazole (PRILOSEC) 40 MG capsule Take 40 mg by mouth daily.   Past Week   albuterol (PROVENTIL HFA;VENTOLIN HFA) 108 (90 Base) MCG/ACT inhaler Inhale 2 puffs into the lungs every 4 (four) hours as needed for wheezing. 1 Inhaler 0    diclofenac (VOLTAREN) 75 MG EC tablet Take 1 tablet (75 mg total) by mouth 2 (two) times daily as needed. (Patient not taking: No sig reported) 60 tablet 1    fluticasone (FLONASE) 50 MCG/ACT nasal spray Place 2 sprays into both nostrils daily. 16 g 0    linaclotide (LINZESS) 145 MCG CAPS capsule Take 145 mcg by mouth daily before supper. (Patient not taking: Reported on 09/10/2020)   Not Taking   promethazine-dextromethorphan (PROMETHAZINE-DM) 6.25-15 MG/5ML syrup Take 5 mLs by mouth 4 (four) times daily as needed for cough. (Patient not taking: Reported on 09/10/2020) 118 mL 0 Not Taking   propranolol (INDERAL) 10 MG tablet Take 10 mg by mouth 2 (two) times daily as needed.        Allergies  Allergen Reactions   Pantoprazole Diarrhea     Past Medical  History:  Diagnosis Date   Cancer [facility])    Patient denies having breast cancer.   Depression    GERD (gastroesophageal reflux disease)    Headache    Hiatal hernia    IBS (irritable bowel syndrome)     Review of systems:  Otherwise negative.    Physical Exam  Gen: Alert, oriented. Appears stated age.  HEENT: PERRLA. Lungs: No respiratory distress CV: RRR Abd: soft, benign, no masses Ext: No edema    Planned procedures: Proceed with EGD/colonoscopy. The patient understands the nature of the planned procedure, indications, risks, alternatives and potential complications including but not limited to bleeding, infection, perforation, damage to internal organs and possible oversedation/side effects from anesthesia. The patient agrees and gives consent to proceed.  Please refer to procedure notes for findings, recommendations and patient disposition/instructions.     Raylene Miyamoto MD, MPH Gastroenterology 09/10/2020  12:45 PM

## 2020-09-13 ENCOUNTER — Encounter: Payer: Self-pay | Admitting: Gastroenterology

## 2020-09-14 LAB — SURGICAL PATHOLOGY

## 2021-03-21 ENCOUNTER — Ambulatory Visit
Admission: RE | Admit: 2021-03-21 | Discharge: 2021-03-21 | Disposition: A | Discharge status: Home / Self Care | Payer: BC Managed Care – PPO | Source: Ambulatory Visit | Attending: Family Medicine | Admitting: Family Medicine

## 2021-03-21 ENCOUNTER — Ambulatory Visit
Admission: RE | Admit: 2021-03-21 | Discharge: 2021-03-21 | Disposition: A | Discharge status: Home / Self Care | Payer: BC Managed Care – PPO | Attending: Family Medicine | Admitting: Family Medicine

## 2021-03-21 ENCOUNTER — Other Ambulatory Visit: Payer: Self-pay | Admitting: Family Medicine

## 2021-03-21 DIAGNOSIS — M79604 Pain in right leg: Secondary | ICD-10-CM

## 2021-03-21 DIAGNOSIS — M545 Low back pain, unspecified: Secondary | ICD-10-CM

## 2022-01-01 ENCOUNTER — Encounter: Payer: Self-pay | Admitting: Emergency Medicine

## 2022-01-01 ENCOUNTER — Ambulatory Visit
Admission: EM | Admit: 2022-01-01 | Discharge: 2022-01-01 | Disposition: A | Discharge status: Home / Self Care | Payer: BC Managed Care – PPO | Attending: Emergency Medicine | Admitting: Emergency Medicine

## 2022-01-01 DIAGNOSIS — J101 Influenza due to other identified influenza virus with other respiratory manifestations: Secondary | ICD-10-CM | POA: Insufficient documentation

## 2022-01-01 DIAGNOSIS — Z1152 Encounter for screening for COVID-19: Secondary | ICD-10-CM | POA: Diagnosis not present

## 2022-01-01 DIAGNOSIS — Z79899 Other long term (current) drug therapy: Secondary | ICD-10-CM | POA: Diagnosis not present

## 2022-01-01 LAB — RESP PANEL BY RT-PCR (FLU A&B, COVID) ARPGX2
Influenza A by PCR: POSITIVE — AB
Influenza B by PCR: NEGATIVE
SARS Coronavirus 2 by RT PCR: NEGATIVE

## 2022-01-01 MED ORDER — OSELTAMIVIR PHOSPHATE 75 MG PO CAPS: 75.0000 mg | ORAL_CAPSULE | Freq: Two times a day (BID) | ORAL | 0 refills | Status: DC

## 2022-01-01 MED ORDER — KETOROLAC TROMETHAMINE 30 MG/ML IJ SOLN: 30.0000 mg | Freq: Once | INTRAMUSCULAR | Status: DC

## 2022-01-01 MED ORDER — KETOROLAC TROMETHAMINE 60 MG/2ML IM SOLN
30.0000 mg | Freq: Once | INTRAMUSCULAR | Status: AC
  Administered 2022-01-01: 30 mg via INTRAMUSCULAR

## 2022-01-01 NOTE — Discharge Instructions (Signed)
Positive for the flu, negative for COVID  Begin Tamiflu every morning and every evening for 5 days, this medicine helps to reduce the amount of virus within the body and therefore reduces your symptoms and timeline that you are sick, does not fully take away illness    You can take Tylenol and/or Ibuprofen as needed for fever reduction and pain relief.   For cough: honey 1/2 to 1 teaspoon (you can dilute the honey in water or another fluid).  You can also use guaifenesin and dextromethorphan for cough. You can use a humidifier for chest congestion and cough.  If you don't have a humidifier, you can sit in the bathroom with the hot shower running.      For sore throat: try warm salt water gargles, cepacol lozenges, throat spray, warm tea or water with lemon/honey, popsicles or ice, or OTC cold relief medicine for throat discomfort.   For congestion: take a daily anti-histamine like Zyrtec, Claritin, and a oral decongestant, such as pseudoephedrine.  You can also use Flonase 1-2 sprays in each nostril daily.   It is important to stay hydrated: drink plenty of fluids (water, gatorade/powerade/pedialyte, juices, or teas) to keep your throat moisturized and help further relieve irritation/discomfort.

## 2022-01-01 NOTE — ED Provider Notes (Addendum)
[facility]    CSN: [id number] Arrival date & time: 01/01/22  0808      History   Chief Complaint Chief Complaint  Patient presents with   Cough   Headache   Otalgia    HPI Patricia Hoffman is a 51 y.o. female.   Patient presents with subjective fever, chills, body aches, nasal congestion, right-sided ear pain, rhinorrhea, nonproductive cough, intermittent wheezing and intermittent generalized headaches for 3 days.  Known sick contact.  Has attempted use of Allegra-D, Mucinex and Tylenol which have been minimally helpful.  Decreased appetite but tolerating fluids.  Denies shortness of breath, chest pain or tightness.    Past Medical History:  Diagnosis Date   Cancer [facility])    Patient denies having breast cancer.   Depression    GERD (gastroesophageal reflux disease)    Headache    Hiatal hernia    IBS (irritable bowel syndrome)     There are no problems to display for this patient.   Past Surgical History:  Procedure Laterality Date   ABDOMINAL HYSTERECTOMY     BREAST BIOPSY Right    both benign ?years per pt   BREAST CYST ASPIRATION Left 2016   left fna-neg   CHOLECYSTECTOMY     COLONOSCOPY WITH PROPOFOL N/A 09/10/2020   Procedure: COLONOSCOPY WITH PROPOFOL;  Surgeon: Lesly Rubenstein, MD;  Location: [facility];  Service: Endoscopy;  Laterality: N/A;   ESOPHAGOGASTRODUODENOSCOPY (EGD) WITH PROPOFOL N/A 09/10/2020   Procedure: ESOPHAGOGASTRODUODENOSCOPY (EGD) WITH PROPOFOL;  Surgeon: Lesly Rubenstein, MD;  Location: [facility] ENDOS[facility];  Service: Endoscopy;  Laterality: N/A;   NERVE REPAIR Left 12/13/2018   Procedure: BAXTER'S RELEASE;  Surgeon: Samara Deist, DPM;  Location: [facility] ORS;  Service: Podiatry;  Laterality: Left;   PLANTAR FASCIA RELEASE Left 12/13/2018   Procedure: ENDOSCOPIC PLANTAR FASCIA RELEASE;  Surgeon: Samara Deist, DPM;  Location: [facility] ORS;  Service: Podiatry;  Laterality: Left;   TONSILLECTOMY      OB History   No obstetric  history on file.      Home Medications    Prior to Admission medications   Medication Sig Start Date End Date Taking? Authorizing Provider  fluticasone (FLONASE) 50 MCG/ACT nasal spray Place 2 sprays into both nostrils daily. 02/04/16  Yes Frederich Cha, MD  omeprazole (PRILOSEC) 40 MG capsule Take 40 mg by mouth daily.   Yes [provider]  albuterol (PROVENTIL HFA;VENTOLIN HFA) 108 (90 Base) MCG/ACT inhaler Inhale 2 puffs into the lungs every 4 (four) hours as needed for wheezing. 10/17/17   Marylene Land, NP  amitriptyline (ELAVIL) 50 MG tablet Take 50 mg by mouth at bedtime.    [provider]  diclofenac (VOLTAREN) 75 MG EC tablet Take 1 tablet (75 mg total) by mouth 2 (two) times daily as needed. Patient not taking: No sig reported 02/06/18   Coral Spikes, DO  FLUoxetine (PROZAC) 40 MG capsule Take 40 mg by mouth daily.    [provider]  linaclotide (LINZESS) 145 MCG CAPS capsule Take 145 mcg by mouth daily before supper. Patient not taking: Reported on 09/10/2020    [provider]  promethazine-dextromethorphan (PROMETHAZINE-DM) 6.25-15 MG/5ML syrup Take 5 mLs by mouth 4 (four) times daily as needed for cough. Patient not taking: Reported on 09/10/2020 06/05/20   Danton Clap, PA-C  propranolol (INDERAL) 10 MG tablet Take 10 mg by mouth 2 (two) times daily as needed.    [provider]    Family History  Family History  Problem Relation Age of Onset   Heart disease Father    Breast cancer Neg Hx     Social History Social History   Tobacco Use   Smoking status: Every Day    Packs/day: 1.00    Years: 20.00    Total pack years: 20.00    Types: Cigarettes   Smokeless tobacco: Never  Vaping Use   Vaping Use: Never used  Substance Use Topics   Alcohol use: No   Drug use: No     Allergies   Pantoprazole   Review of Systems Review of Systems  Constitutional:  Positive for chills and fever. Negative for activity change,  appetite change, diaphoresis, fatigue and unexpected weight change.  HENT:  Positive for congestion, ear pain and rhinorrhea. Negative for dental problem, drooling, ear discharge, facial swelling, hearing loss, mouth sores, nosebleeds, postnasal drip, sinus pressure, sinus pain, sneezing, sore throat, tinnitus, trouble swallowing and voice change.   Respiratory:  Positive for cough and wheezing. Negative for apnea, choking, chest tightness, shortness of breath and stridor.   Cardiovascular: Negative.   Gastrointestinal: Negative.   Musculoskeletal:  Positive for myalgias. Negative for arthralgias, back pain, gait problem, joint swelling, neck pain and neck stiffness.  Neurological:  Positive for headaches. Negative for dizziness, tremors, seizures, syncope, facial asymmetry, speech difficulty, weakness, light-headedness and numbness.  All other systems reviewed and are negative.    Physical Exam Triage Vital Signs ED Triage Vitals  Enc Vitals Group     BP 01/01/22 0818 107/79     Pulse Rate 01/01/22 0818 (!) 107     Resp 01/01/22 0818 14     Temp 01/01/22 0818 99 F (37.2 C)     Temp Source 01/01/22 0818 Oral     SpO2 01/01/22 0818 100 %     Weight 01/01/22 0817 195 lb (88.5 kg)     Height 01/01/22 0817 '5\' 4"'$  (1.626 m)     Head Circumference --      Peak Flow --      Pain Score 01/01/22 0816 6     Pain Loc --      Pain Edu? --      Excl. in [place]? --    No data found.  Updated Vital Signs BP 107/79 (BP Location: Left Arm)   Pulse (!) 107   Temp 99 F (37.2 C) (Oral)   Resp 14   Ht '5\' 4"'$  (1.626 m)   Wt 195 lb (88.5 kg)   SpO2 100%   BMI 33.47 kg/m   Visual Acuity Right Eye Distance:   Left Eye Distance:   Bilateral Distance:    Right Eye Near:   Left Eye Near:    Bilateral Near:     Physical Exam Constitutional:      Appearance: Normal appearance.  HENT:     Head: Normocephalic.     Right Ear: Tympanic membrane, ear canal and external ear normal.     Left Ear:  Tympanic membrane, ear canal and external ear normal.     Nose: Congestion and rhinorrhea present.     Mouth/Throat:     Mouth: Mucous membranes are moist.     Pharynx: Posterior oropharyngeal erythema present.  Eyes:     Extraocular Movements: Extraocular movements intact.  Cardiovascular:     Rate and Rhythm: Normal rate and regular rhythm.     Pulses: Normal pulses.     Heart sounds: Normal heart sounds.  Pulmonary:  Effort: Pulmonary effort is normal.     Breath sounds: Normal breath sounds.  Skin:    General: Skin is warm and dry.  Neurological:     Mental Status: She is alert and oriented to person, place, and time. Mental status is at baseline.  Psychiatric:        Mood and Affect: Mood normal.        Behavior: Behavior normal.      UC Treatments / Results  Labs (all labs ordered are listed, but only abnormal results are displayed) Labs Reviewed  RESP PANEL BY RT-PCR (FLU A&B, COVID) [id number]    EKG   Radiology No results found.  Procedures Procedures (including critical care time)  Medications Ordered in UC Medications - No data to display  Initial Impression / Assessment and Plan / UC Course  I have reviewed the triage vital signs and the nursing notes.  Pertinent labs & imaging results that were available during my care of the patient were reviewed by me and considered in my medical decision making (see chart for details).  Influenza A  Confirmed by PCR, negative for COVID, discussed with patient, vital signs are stable and she is no signs of distress nontoxic-appearing, Toradol injection given in office as cough is most worrisome symptom, may continue use of over-the-counter analgesics outpatient, Tamiflu prescribed additionally, may use over-the-counter medications for additional supportive care with urgent care follow-up as needed, work note given Final Clinical Impressions(s) / UC Diagnoses   Final diagnoses:  None   Discharge Instructions    None    ED Prescriptions   None    PDMP not reviewed this encounter.   Hans Eden, NP 01/01/22 0831    Hans Eden, NP 01/01/22 [phone]

## 2022-01-01 NOTE — ED Triage Notes (Signed)
Patient c/o cough, congestion and headache that started on Thursday.  Patient also c/o right ear pain.  Patient unsure of fevers.

## 2022-07-18 ENCOUNTER — Ambulatory Visit
Admission: EM | Admit: 2022-07-18 | Discharge: 2022-07-18 | Disposition: A | Discharge status: Home / Self Care | Payer: BC Managed Care – PPO | Attending: Emergency Medicine | Admitting: Emergency Medicine

## 2022-07-18 DIAGNOSIS — F17200 Nicotine dependence, unspecified, uncomplicated: Secondary | ICD-10-CM | POA: Diagnosis not present

## 2022-07-18 DIAGNOSIS — J069 Acute upper respiratory infection, unspecified: Secondary | ICD-10-CM

## 2022-07-18 MED ORDER — AMOXICILLIN-POT CLAVULANATE 875-125 MG PO TABS: 1.0000 | ORAL_TABLET | Freq: Two times a day (BID) | ORAL | 0 refills | Status: DC

## 2022-07-18 NOTE — ED Triage Notes (Signed)
Patient presents to UC for facial pain, post nasal drip, nasal congestion x 1 week. States she took tylenol, Careers adviser D, and mucinex. States she does not tolerated those medications well due to her IBS so she quit taking them. Hx of sinus infections.  Denies fever.

## 2022-07-18 NOTE — ED Provider Notes (Signed)
[facility]    CSN: [id number] Arrival date & time: 07/18/22  1211      History   Chief Complaint No chief complaint on file.   HPI Patricia Hoffman is a 52 y.o. female.   52 year old female, Patricia Hoffman, presents to urgent care with chief complaint of nasal congestion and postnasal drip for 10 days.  Patient states she took Tylenol Allegra-D and Mucinex without relief, states she usually gets a sinus infection this time a year.  Patient has cut down her smoking cigarettes to half a pack per day.  Past medical history of: Smoker, IBS, GERD, recurrent sinus infections  The history is provided by the patient. No language interpreter was used.    Past Medical History:  Diagnosis Date   Cancer [facility])    Patient denies having breast cancer.   Depression    GERD (gastroesophageal reflux disease)    Headache    Hiatal hernia    IBS (irritable bowel syndrome)     Patient Active Problem List   Diagnosis Date Noted   Acute upper respiratory infection 07/18/2022   Smoker 07/18/2022    Past Surgical History:  Procedure Laterality Date   ABDOMINAL HYSTERECTOMY     BREAST BIOPSY Right    both benign ?years per pt   BREAST CYST ASPIRATION Left 2016   left fna-neg   CHOLECYSTECTOMY     COLONOSCOPY WITH PROPOFOL N/A 09/10/2020   Procedure: COLONOSCOPY WITH PROPOFOL;  Surgeon: Regis Bill, MD;  Location: [facility] ENDOSCOPY;  Service: Endoscopy;  Laterality: N/A;   ESOPHAGOGASTRODUODENOSCOPY (EGD) WITH PROPOFOL N/A 09/10/2020   Procedure: ESOPHAGOGASTRODUODENOSCOPY (EGD) WITH PROPOFOL;  Surgeon: Regis Bill, MD;  Location: [facility] ENDOSCOPY;  Service: Endoscopy;  Laterality: N/A;   NERVE REPAIR Left 12/13/2018   Procedure: BAXTER'S RELEASE;  Surgeon: Gwyneth Revels, DPM;  Location: [facility] ORS;  Service: Podiatry;  Laterality: Left;   PLANTAR FASCIA RELEASE Left 12/13/2018   Procedure: ENDOSCOPIC PLANTAR FASCIA RELEASE;  Surgeon: Gwyneth Revels, DPM;  Location: [facility]  ORS;  Service: Podiatry;  Laterality: Left;   TONSILLECTOMY      OB History   No obstetric history on file.      Home Medications    Prior to Admission medications   Medication Sig Start Date End Date Taking? Authorizing Provider  amoxicillin-clavulanate (AUGMENTIN) 875-125 MG tablet Take 1 tablet by mouth every 12 (twelve) hours. 07/18/22  Yes Dajahnae Vondra, Para March, NP  albuterol (PROVENTIL HFA;VENTOLIN HFA) 108 (90 Base) MCG/ACT inhaler Inhale 2 puffs into the lungs every 4 (four) hours as needed for wheezing. 10/17/17   Renford Dills, NP  amitriptyline (ELAVIL) 50 MG tablet Take 50 mg by mouth at bedtime.    [provider]  diclofenac (VOLTAREN) 75 MG EC tablet Take 1 tablet (75 mg total) by mouth 2 (two) times daily as needed. Patient not taking: No sig reported 02/06/18   Tommie Sams, DO  FLUoxetine (PROZAC) 40 MG capsule Take 40 mg by mouth daily.    [provider]  fluticasone (FLONASE) 50 MCG/ACT nasal spray Place 2 sprays into both nostrils daily. 02/04/16   Hassan Rowan, MD  linaclotide Karlene Einstein) 145 MCG CAPS capsule Take 145 mcg by mouth daily before supper. Patient not taking: Reported on 09/10/2020    [provider]  omeprazole (PRILOSEC) 40 MG capsule Take 40 mg by mouth daily.    [provider]  oseltamivir (TAMIFLU) 75 MG capsule Take 1 capsule (75 mg total) by mouth every 12 (  twelve) hours. 01/01/22   White, Elita Boone, NP  promethazine-dextromethorphan (PROMETHAZINE-DM) 6.25-15 MG/5ML syrup Take 5 mLs by mouth 4 (four) times daily as needed for cough. Patient not taking: Reported on 09/10/2020 06/05/20   Shirlee Latch, PA-C  propranolol (INDERAL) 10 MG tablet Take 10 mg by mouth 2 (two) times daily as needed.    [provider]    Family History Family History  Problem Relation Age of Onset   Heart disease Father    Breast cancer Neg Hx     Social History Social History   Tobacco Use   Smoking status: Every Day     Packs/day: 1.00    Years: 20.00    Additional pack years: 0.00    Total pack years: 20.00    Types: Cigarettes   Smokeless tobacco: Never  Vaping Use   Vaping Use: Never used  Substance Use Topics   Alcohol use: No   Drug use: No     Allergies   Pantoprazole   Review of Systems Review of Systems  Constitutional:  Negative for fever.  HENT:  Positive for congestion, postnasal drip, sinus pressure and sinus pain.   All other systems reviewed and are negative.    Physical Exam Triage Vital Signs ED Triage Vitals  Enc Vitals Group     BP      Pulse      Resp      Temp      Temp src      SpO2      Weight      Height      Head Circumference      Peak Flow      Pain Score      Pain Loc      Pain Edu?      Excl. in [place]?    No data found.  Updated Vital Signs BP (!) 144/89 (BP Location: Left Arm)   Pulse 87   Temp 98.4 F (36.9 C) (Oral)   Resp 16   SpO2 96%   Visual Acuity Right Eye Distance:   Left Eye Distance:   Bilateral Distance:    Right Eye Near:   Left Eye Near:    Bilateral Near:     Physical Exam Vitals and nursing note reviewed.  Constitutional:      General: She is not in acute distress.    Appearance: She is well-developed.  HENT:     Head: Normocephalic.     Right Ear: Tympanic membrane is retracted.     Left Ear: Tympanic membrane is retracted.     Nose: Congestion present.     Right Sinus: Maxillary sinus tenderness present.     Left Sinus: Maxillary sinus tenderness present.     Mouth/Throat:     Lips: Pink.     Mouth: Mucous membranes are moist.     Pharynx: Oropharynx is clear.  Eyes:     General: Lids are normal.     Conjunctiva/sclera: Conjunctivae normal.     Pupils: Pupils are equal, round, and reactive to light.  Neck:     Trachea: No tracheal deviation.  Cardiovascular:     Rate and Rhythm: Regular rhythm.     Pulses: Normal pulses.     Heart sounds: Normal heart sounds. No murmur heard. Pulmonary:     Effort:  Pulmonary effort is normal.     Breath sounds: Normal breath sounds.  Abdominal:     General: Bowel sounds are normal.  Palpations: Abdomen is soft.     Tenderness: There is no abdominal tenderness.  Musculoskeletal:        General: Normal range of motion.     Cervical back: Normal range of motion.  Lymphadenopathy:     Cervical: No cervical adenopathy.  Skin:    General: Skin is warm and dry.     Findings: No rash.  Neurological:     General: No focal deficit present.     Mental Status: She is alert and oriented to person, place, and time.     GCS: GCS eye subscore is 4. GCS verbal subscore is 5. GCS motor subscore is 6.  Psychiatric:        Attention and Perception: Attention normal.        Mood and Affect: Mood normal.        Speech: Speech normal.        Behavior: Behavior normal. Behavior is cooperative.      UC Treatments / Results  Labs (all labs ordered are listed, but only abnormal results are displayed) Labs Reviewed - No data to display  EKG   Radiology No results found.  Procedures Procedures (including critical care time)  Medications Ordered in UC Medications - No data to display  Initial Impression / Assessment and Plan / UC Course  I have reviewed the triage vital signs and the nursing notes.  Pertinent labs & imaging results that were available during my care of the patient were reviewed by me and considered in my medical decision making (see chart for details).    Patient verbalized understanding to this provider of plan of care. Discussed smoking cessation, pt has cut back.  Ddx: Acute upper respiratory infection, sinusitis, allergies, smoker Final Clinical Impressions(s) / UC Diagnoses   Final diagnoses:  Acute upper respiratory infection  Smoker     Discharge Instructions      Rest,push fluids, take abx as directed. May take OTC meds as label directed for symptom management. Follow up with ENT of choice if issues continue. Stop  smoking. Go to Er for emergent, or worsening issues.      ED Prescriptions     Medication Sig Dispense Auth. Provider   amoxicillin-clavulanate (AUGMENTIN) 875-125 MG tablet Take 1 tablet by mouth every 12 (twelve) hours. 14 tablet Brithney Bensen, Para March, NP      PDMP not reviewed this encounter.   Clancy Gourd, NP 07/18/22 1704

## 2022-07-18 NOTE — Discharge Instructions (Addendum)
Rest,push fluids, take abx as directed. May take OTC meds as label directed for symptom management. Follow up with ENT of choice if issues continue. Stop smoking. Go to Er for emergent, or worsening issues.

## 2022-09-15 ENCOUNTER — Encounter: Payer: Self-pay | Admitting: Emergency Medicine

## 2022-09-15 ENCOUNTER — Ambulatory Visit
Admission: EM | Admit: 2022-09-15 | Discharge: 2022-09-15 | Disposition: A | Discharge status: Home / Self Care | Payer: BC Managed Care – PPO | Attending: Emergency Medicine | Admitting: Emergency Medicine

## 2022-09-15 DIAGNOSIS — T7840XA Allergy, unspecified, initial encounter: Secondary | ICD-10-CM

## 2022-09-15 MED ORDER — FEXOFENADINE HCL 180 MG PO TABS: 180.0000 mg | ORAL_TABLET | Freq: Every day | ORAL | 0 refills | Status: AC

## 2022-09-15 MED ORDER — FAMOTIDINE 20 MG PO TABS: 20.0000 mg | ORAL_TABLET | Freq: Two times a day (BID) | ORAL | 0 refills | Status: AC

## 2022-09-15 MED ORDER — PREDNISONE 10 MG (21) PO TBPK: ORAL_TABLET | ORAL | 0 refills | Status: DC

## 2022-09-15 NOTE — Discharge Instructions (Addendum)
The cause of the swelling and itching is unknown but it does present as if it is an allergic reaction and I am going to treat you for such with a comminution of antihistamines and steroids.  Take the Allegra 108 mg daily to help with itching and inflammation.  You may supplement this at nighttime with Benadryl 50 mg which will both help with sleep and itching.  I would should begin the prednisone taper this morning.  Make sure you been symptom-free take it.  He will take it each morning at breakfast going forward for period of 6 days.  This will help you with the swelling and itching as well.  Take the Pepcid 20 mg twice daily for the next 6 days as well to help with itching and redness.  If your symptoms resolve and then return I would suggest you talk to your PCP about a referral to an allergist.  If your swelling around her eye worsens or you develop changes in vision you need to see an eye doctor.  If you develop any swelling of your lips, tongue, or difficulty breathing you need to call 911 or go to the ER.

## 2022-09-15 NOTE — ED Provider Notes (Signed)
[facility]    CSN: [id number] Arrival date & time: 09/15/22  0804      History   Chief Complaint Chief Complaint  Patient presents with   Insect Bite    HPI Patricia Hoffman is a 52 y.o. female.   HPI  52 year old female with a past medical history significant for GERD, depression, breast cancer, IBS, and hiatal hernia presents for evaluation of skin rash.  She reports that she just moved into a new home and she is experiencing red itchy spots a couple of times a week.  She became concerned because she now has swelling to her left eyelid and periorbital area.  She denies any changes in vision.  She denies any plant exposure or feeling any insect bites.  She does have a dog at home who stays in a kennel and is an inside dog.  She is unaware of any environmental exposure that may have triggered this incident.  She has undergone allergy testing but it was many years ago.  Past Medical History:  Diagnosis Date   Cancer [facility])    Patient denies having breast cancer.   Depression    GERD (gastroesophageal reflux disease)    Headache    Hiatal hernia    IBS (irritable bowel syndrome)     Patient Active Problem List   Diagnosis Date Noted   Acute upper respiratory infection 07/18/2022   Smoker 07/18/2022    Past Surgical History:  Procedure Laterality Date   ABDOMINAL HYSTERECTOMY     BREAST BIOPSY Right    both benign ?years per pt   BREAST CYST ASPIRATION Left 2016   left fna-neg   CHOLECYSTECTOMY     COLONOSCOPY WITH PROPOFOL N/A 09/10/2020   Procedure: COLONOSCOPY WITH PROPOFOL;  Surgeon: Regis Bill, MD;  Location: [facility] ENDOSCOPY;  Service: Endoscopy;  Laterality: N/A;   ESOPHAGOGASTRODUODENOSCOPY (EGD) WITH PROPOFOL N/A 09/10/2020   Procedure: ESOPHAGOGASTRODUODENOSCOPY (EGD) WITH PROPOFOL;  Surgeon: Regis Bill, MD;  Location: [facility] END[facility];  Service: Endoscopy;  Laterality: N/A;   NERVE REPAIR Left 12/13/2018   Procedure: BAXTER'S RELEASE;   Surgeon: Gwyneth Revels, DPM;  Location: [facility] ORS;  Service: Podiatry;  Laterality: Left;   PLANTAR FASCIA RELEASE Left 12/13/2018   Procedure: ENDOSCOPIC PLANTAR FASCIA RELEASE;  Surgeon: Gwyneth Revels, DPM;  Location: [facility] ORS;  Service: Podiatry;  Laterality: Left;   TONSILLECTOMY      OB History   No obstetric history on file.      Home Medications    Prior to Admission medications   Medication Sig Start Date End Date Taking? Authorizing Provider  famotidine (PEPCID) 20 MG tablet Take 1 tablet (20 mg total) by mouth 2 (two) times daily. 09/15/22  Yes Becky Augusta, NP  fexofenadine (ALLEGRA) 180 MG tablet Take 1 tablet (180 mg total) by mouth daily. 09/15/22  Yes Becky Augusta, NP  predniSONE (STERAPRED UNI-PAK 21 TAB) 10 MG (21) TBPK tablet Take 6 tablets on day 1, 5 tablets day 2, 4 tablets day 3, 3 tablets day 4, 2 tablets day 5, 1 tablet day 6 09/15/22  Yes Becky Augusta, NP  albuterol (PROVENTIL HFA;VENTOLIN HFA) 108 (90 Base) MCG/ACT inhaler Inhale 2 puffs into the lungs every 4 (four) hours as needed for wheezing. 10/17/17   Renford Dills, NP  amitriptyline (ELAVIL) 50 MG tablet Take 50 mg by mouth at bedtime.    [provider]  FLUoxetine (PROZAC) 40 MG capsule Take 40 mg by mouth daily.  [provider]  fluticasone (FLONASE) 50 MCG/ACT nasal spray Place 2 sprays into both nostrils daily. 02/04/16   Hassan Rowan, MD  linaclotide Karlene Einstein) 145 MCG CAPS capsule Take 145 mcg by mouth daily before supper. Patient not taking: Reported on 09/10/2020    [provider]  omeprazole (PRILOSEC) 40 MG capsule Take 40 mg by mouth daily.    [provider]  propranolol (INDERAL) 10 MG tablet Take 10 mg by mouth 2 (two) times daily as needed.    [provider]    Family History Family History  Problem Relation Age of Onset   Heart disease Father    Breast cancer Neg Hx     Social History Social History   Tobacco Use   Smoking status:  Every Day    Current packs/day: 1.00    Average packs/day: 1 pack/day for 20.0 years (20.0 ttl pk-yrs)    Types: Cigarettes   Smokeless tobacco: Never  Vaping Use   Vaping status: Never Used  Substance Use Topics   Alcohol use: No   Drug use: No     Allergies   Pantoprazole   Review of Systems Review of Systems  Constitutional:  Negative for fever.  Skin:  Positive for color change and rash.     Physical Exam Triage Vital Signs ED Triage Vitals  Encounter Vitals Group     BP 09/15/22 0819 (!) 156/93     Systolic BP Percentile --      Diastolic BP Percentile --      Pulse Rate 09/15/22 0819 73     Resp 09/15/22 0819 14     Temp 09/15/22 0819 98.6 F (37 C)     Temp Source 09/15/22 0819 Oral     SpO2 09/15/22 0819 97 %     Weight 09/15/22 0817 175 lb (79.4 kg)     Height 09/15/22 0817 5\' 4"  (1.626 m)     Head Circumference --      Peak Flow --      Pain Score 09/15/22 0817 0     Pain Loc --      Pain Education --      Exclude from Growth Chart --    No data found.  Updated Vital Signs BP (!) 156/93 (BP Location: Right Arm)   Pulse 73   Temp 98.6 F (37 C) (Oral)   Resp 14   Ht 5\' 4"  (1.626 m)   Wt 175 lb (79.4 kg)   SpO2 97%   BMI 30.04 kg/m   Visual Acuity Right Eye Distance:   Left Eye Distance:   Bilateral Distance:    Right Eye Near:   Left Eye Near:    Bilateral Near:     Physical Exam Vitals and nursing note reviewed.  Constitutional:      Appearance: Normal appearance. She is not ill-appearing.  HENT:     Head: Normocephalic and atraumatic.  Eyes:     General: No scleral icterus.       Right eye: No discharge.        Left eye: No discharge.     Extraocular Movements: Extraocular movements intact.     Conjunctiva/sclera: Conjunctivae normal.     Pupils: Pupils are equal, round, and reactive to light.  Skin:    General: Skin is warm and dry.     Capillary Refill: Capillary refill takes less than 2 seconds.     Findings: Erythema  and rash present.  Neurological:  General: No focal deficit present.     Mental Status: She is alert and oriented to person, place, and time.      UC Treatments / Results  Labs (all labs ordered are listed, but only abnormal results are displayed) Labs Reviewed - No data to display  EKG   Radiology No results found.  Procedures Procedures (including critical care time)  Medications Ordered in UC Medications - No data to display  Initial Impression / Assessment and Plan / UC Course  I have reviewed the triage vital signs and the nursing notes.  Pertinent labs & imaging results that were available during my care of the patient were reviewed by me and considered in my medical decision making (see chart for details).   Patient is a pleasant, nontoxic-appearing 52 year old female today for evaluation of skin rash on her right hand, right forearm, and left periorbital region.      The areas of redness are itchy but they are not painful.  The left eye is mildly tender to palpation.  No induration or fluctuance.  She denies any change in vision.  Her pupils equal round reactive and EOMs intact.  Due to an unknown causative agent I will treat the patient as if this is an allergic reaction with prednisone taper and dual antihistamine therapy.  I have advised her that if her symptoms resolve and then return she should speak to her PCP about referral to an allergist for testing.  Final Clinical Impressions(s) / UC Diagnoses   Final diagnoses:  Allergic reaction, initial encounter     Discharge Instructions      The cause of the swelling and itching is unknown but it does present as if it is an allergic reaction and I am going to treat you for such with a comminution of antihistamines and steroids.  Take the Allegra 108 mg daily to help with itching and inflammation.  You may supplement this at nighttime with Benadryl 50 mg which will both help with sleep and itching.  I  would should begin the prednisone taper this morning.  Make sure you been symptom-free take it.  He will take it each morning at breakfast going forward for period of 6 days.  This will help you with the swelling and itching as well.  Take the Pepcid 20 mg twice daily for the next 6 days as well to help with itching and redness.  If your symptoms resolve and then return I would suggest you talk to your PCP about a referral to an allergist.  If your swelling around her eye worsens or you develop changes in vision you need to see an eye doctor.  If you develop any swelling of your lips, tongue, or difficulty breathing you need to call 911 or go to the ER.     ED Prescriptions     Medication Sig Dispense Auth. Provider   predniSONE (STERAPRED UNI-PAK 21 TAB) 10 MG (21) TBPK tablet Take 6 tablets on day 1, 5 tablets day 2, 4 tablets day 3, 3 tablets day 4, 2 tablets day 5, 1 tablet day 6 21 tablet Becky Augusta, NP   fexofenadine (ALLEGRA) 180 MG tablet Take 1 tablet (180 mg total) by mouth daily. 30 tablet Becky Augusta, NP   famotidine (PEPCID) 20 MG tablet Take 1 tablet (20 mg total) by mouth 2 (two) times daily. 30 tablet Becky Augusta, NP      PDMP not reviewed this encounter.   Becky Augusta, NP 09/15/22 [phone]

## 2022-09-15 NOTE — ED Triage Notes (Signed)
Patient thinks something might have bit her couple days ago.  Patient reports red swollen areas on her left ye lid, and right hand and right forearm for the past 2-3 days.  Patient reports that the areas are itchy.

## 2022-11-01 IMAGING — MG MM DIGITAL DIAGNOSTIC UNILAT*L* W/ TOMO W/ CAD
4 series · 4 of 12 positions shown · non-contrast
Comparison: Previous exam(s).

CLINICAL DATA: Callback for LEFT breast focal asymmetry

EXAM:
DIGITAL DIAGNOSTIC UNILATERAL LEFT MAMMOGRAM WITH TOMOSYNTHESIS AND
CAD; ULTRASOUND LEFT BREAST LIMITED
TECHNIQUE: Left digital diagnostic mammography and breast tomosynthesis was
performed. The images were evaluated with computer-aided detection.;
Targeted ultrasound examination of the left breast was performed

[L CC synth-2D]
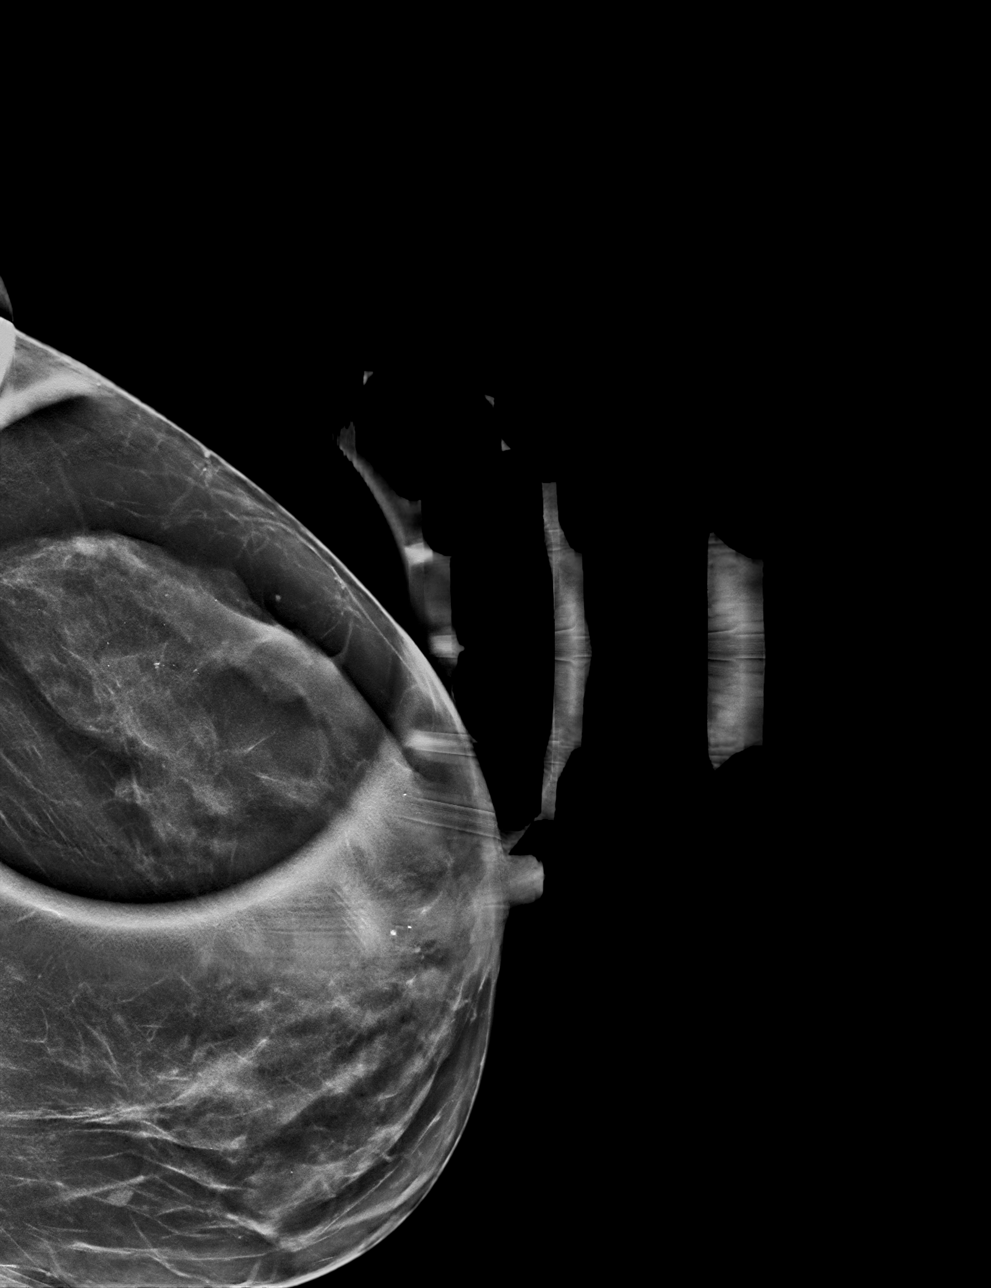

[L MLO synth-2D]
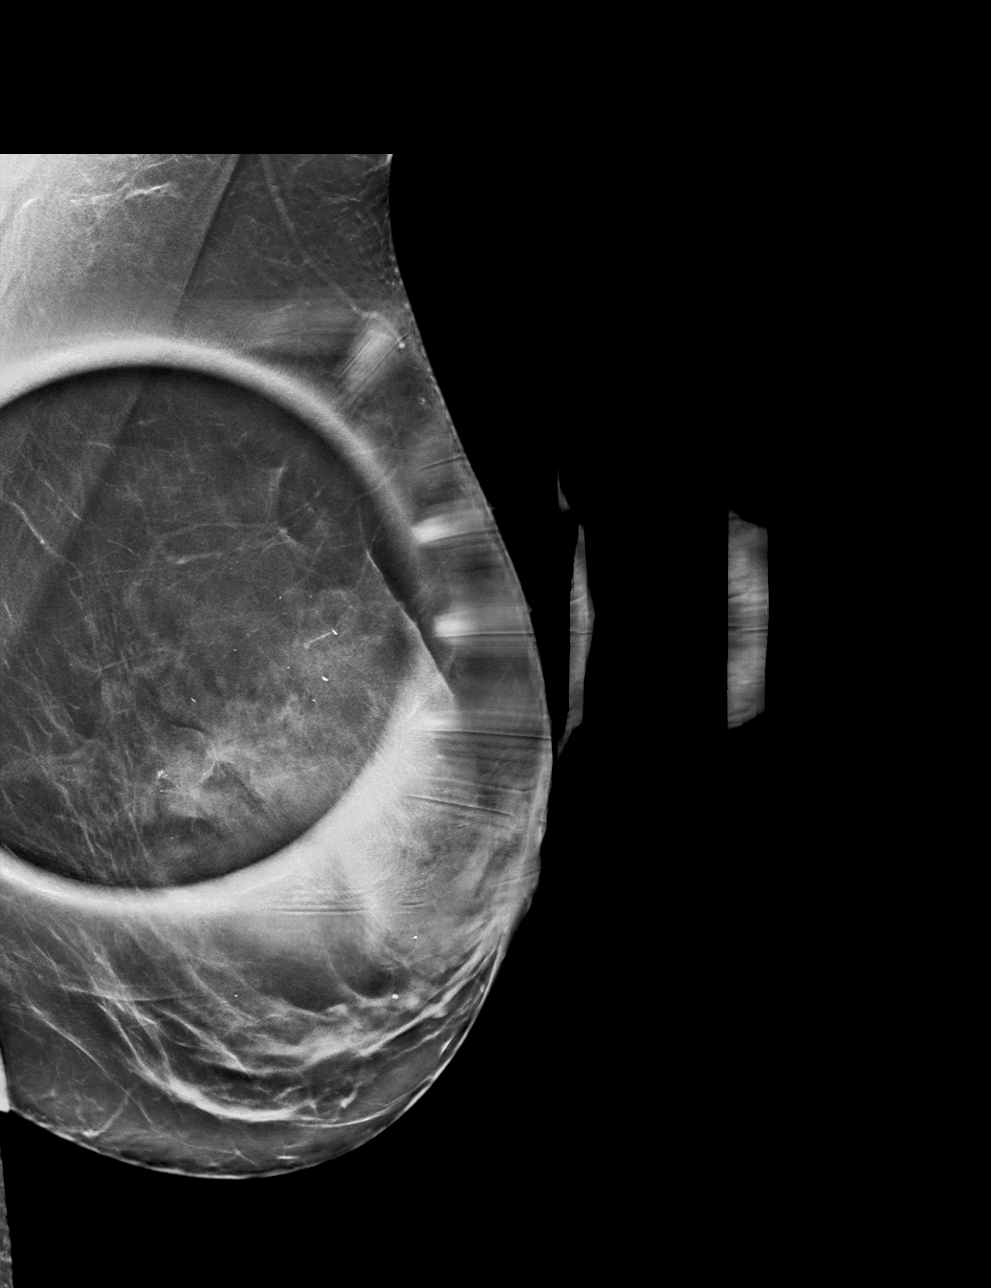

[L MLO tomo · tomo slice 39/78.0]
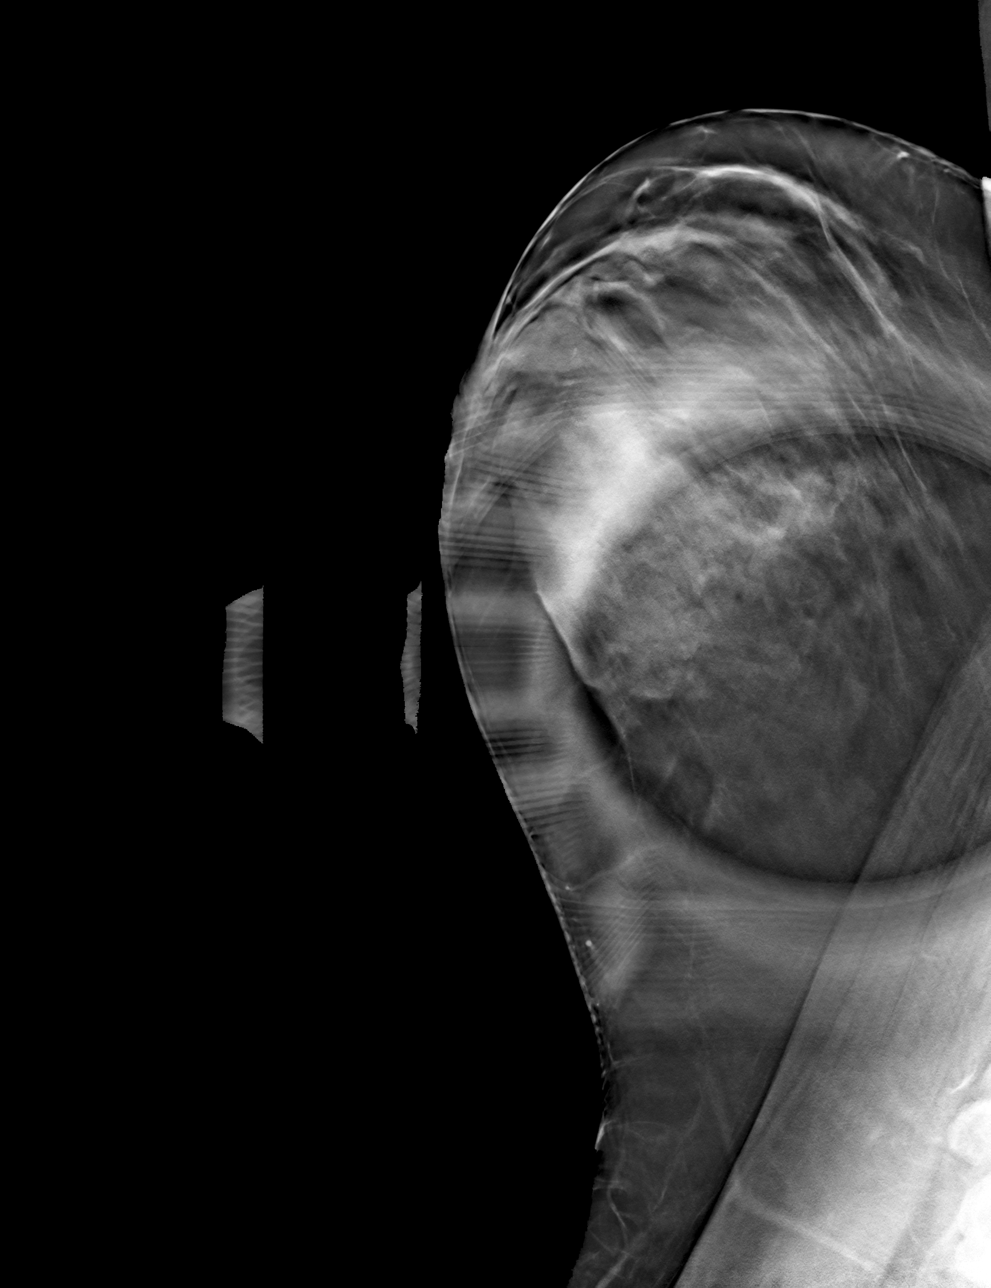

[L CC tomo · tomo slice 35/69.0]
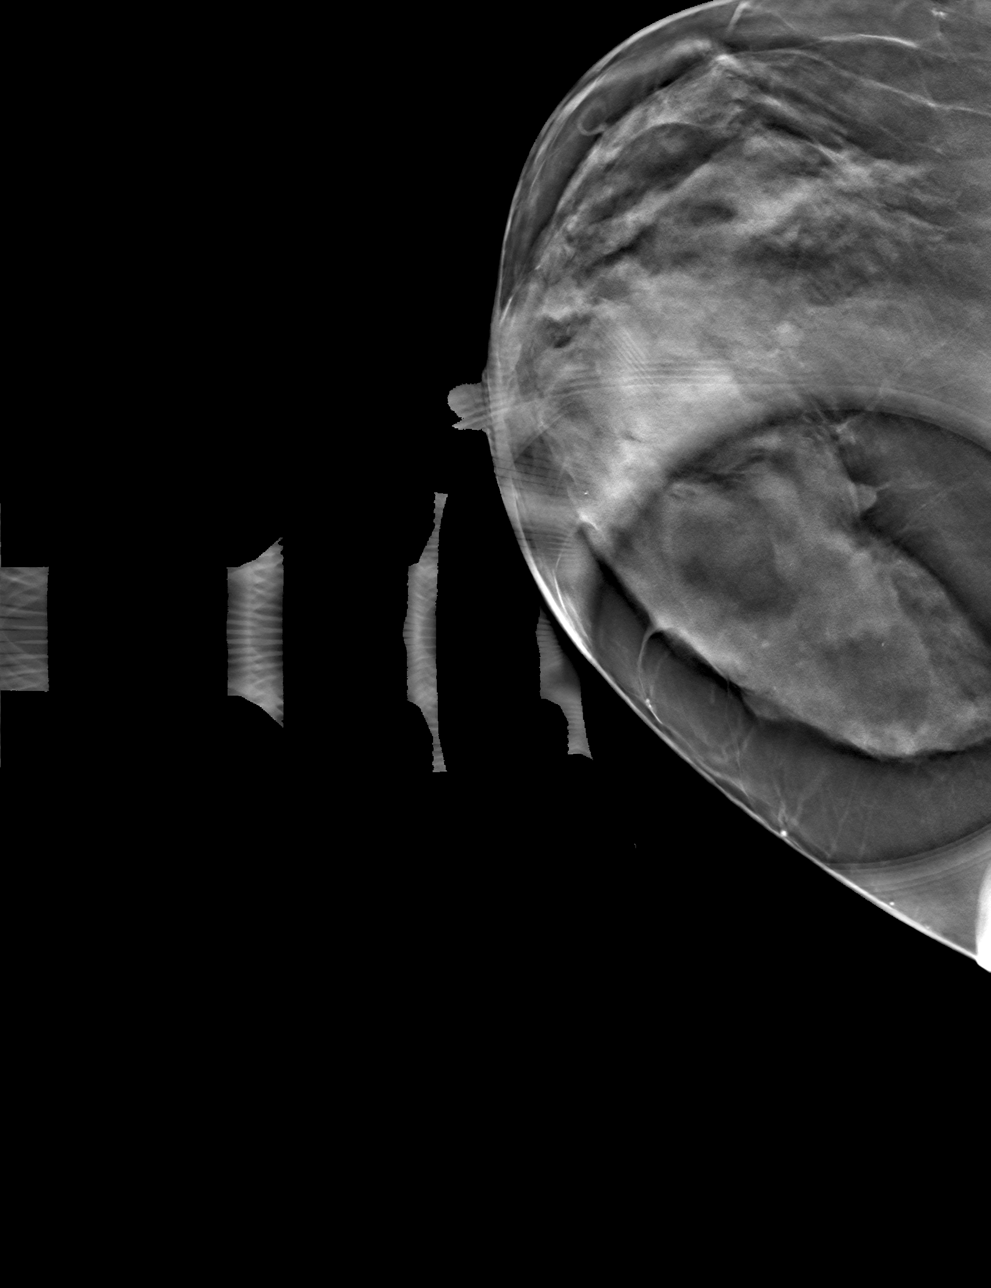

[4 of 12 positions shown; findings below may reference images not displayed]

ACR Breast Density Category d: The breast tissue is extremely dense,
which lowers the sensitivity of mammography.
FINDINGS: Spot compression tomosynthesis views confirm persistence of an oval
circumscribed mass at the site of screening mammographic concern.
There are innumerable additional oval circumscribed masses,
consistent with history of benign cysts.

On physical exam, no suspicious mass is appreciated.

Targeted ultrasound was performed of the outer breast. At the site
of screening mammographic concern at 3 o'clock 7 cm from the nipple,
there is an oval circumscribed anechoic mass with posterior acoustic
enhancement and thin internal septations without vascularity. It
measures 6 by 2 x 5 mm. This is consistent with a benign cluster of
cysts. Multiple additional simple and similar appearing clusters of
cysts were noted during real-time examination. An additional benign
cyst is documented at 3 o'clock 5 cm from the nipple which measures
1.1 cm. No suspicious cystic or solid mass is seen.
IMPRESSION: There is a benign cyst at the site of screening mammographic
concern. No mammographic or sonographic evidence of malignancy.

RECOMMENDATION:
Screening mammogram in one year.(Code:[id number])

I have discussed the findings and recommendations with the patient.
If applicable, a reminder letter will be sent to the patient
regarding the next appointment.

BI-RADS CATEGORY  2: Benign.

## 2023-02-18 ENCOUNTER — Encounter: Payer: Self-pay | Admitting: Emergency Medicine

## 2023-02-18 ENCOUNTER — Ambulatory Visit
Admission: EM | Admit: 2023-02-18 | Discharge: 2023-02-18 | Disposition: A | Discharge status: Home / Self Care | Payer: BC Managed Care – PPO | Attending: Emergency Medicine | Admitting: Emergency Medicine

## 2023-02-18 DIAGNOSIS — J069 Acute upper respiratory infection, unspecified: Secondary | ICD-10-CM | POA: Insufficient documentation

## 2023-02-18 LAB — GROUP A STREP BY PCR: Group A Strep by PCR: NOT DETECTED

## 2023-02-18 MED ORDER — PROMETHAZINE-DM 6.25-15 MG/5ML PO SYRP: 5.0000 mL | ORAL_SOLUTION | Freq: Four times a day (QID) | ORAL | 0 refills | Status: AC | PRN

## 2023-02-18 MED ORDER — IPRATROPIUM BROMIDE 0.06 % NA SOLN: 2.0000 | Freq: Four times a day (QID) | NASAL | 12 refills | Status: AC

## 2023-02-18 MED ORDER — AMOXICILLIN-POT CLAVULANATE 875-125 MG PO TABS: 1.0000 | ORAL_TABLET | Freq: Two times a day (BID) | ORAL | 0 refills | Status: AC

## 2023-02-18 MED ORDER — BENZONATATE 100 MG PO CAPS: 200.0000 mg | ORAL_CAPSULE | Freq: Three times a day (TID) | ORAL | 0 refills | Status: AC

## 2023-02-18 NOTE — ED Provider Notes (Signed)
 [facility]    CSN: [id number] Arrival date & time: 02/18/23  0930      History   Chief Complaint Chief Complaint  Patient presents with   Cough   Sore Throat    HPI Patricia Hoffman is a 53 y.o. female.   HPI  53 year old female with a past medical history significant for IBS, hiatal hernia, headache, GERD, depression presents for evaluation of 2 weeks worth of respiratory symptoms that include a nonproductive cough, subjective fever, runny nose and nasal congestion, off-and-on sore throat, ear pain, and wheezing.  She reports that her grandchild was sick and sneeze right her face.  Her grandchild has not been to the pediatrician for evaluation.  No shortness of breath.  Past Medical History:  Diagnosis Date   Cancer [facility])    Patient denies having breast cancer.   Depression    GERD (gastroesophageal reflux disease)    Headache    Hiatal hernia    IBS (irritable bowel syndrome)     Patient Active Problem List   Diagnosis Date Noted   Acute upper respiratory infection 07/18/2022   Smoker 07/18/2022    Past Surgical History:  Procedure Laterality Date   ABDOMINAL HYSTERECTOMY     BREAST BIOPSY Right    both benign ?years per pt   BREAST CYST ASPIRATION Left 2016   left fna-neg   CHOLECYSTECTOMY     COLONOSCOPY WITH PROPOFOL  N/A 09/10/2020   Procedure: COLONOSCOPY WITH PROPOFOL ;  Surgeon: Maryruth Ole DASEN, MD;  Location: [facility] END[facility];  Service: Endoscopy;  Laterality: N/A;   ESOPHAGOGASTRODUODENOSCOPY (EGD) WITH PROPOFOL  N/A 09/10/2020   Procedure: ESOPHAGOGASTRODUODENOSCOPY (EGD) WITH PROPOFOL ;  Surgeon: Maryruth Ole DASEN, MD;  Location: [facility];  Service: Endoscopy;  Laterality: N/A;   NERVE REPAIR Left 12/13/2018   Procedure: BAXTER'S RELEASE;  Surgeon: Ashley Soulier, DPM;  Location: [facility];  Service: Podiatry;  Laterality: Left;   PLANTAR FASCIA RELEASE Left 12/13/2018   Procedure: ENDOSCOPIC PLANTAR FASCIA RELEASE;  Surgeon: Ashley Soulier, DPM;  Location: [facility];  Service: Podiatry;  Laterality: Left;   TONSILLECTOMY      OB History   No obstetric history on file.      Home Medications    Prior to Admission medications   Medication Sig Start Date End Date Taking? Authorizing Provider  amoxicillin -clavulanate (AUGMENTIN ) 875-125 MG tablet Take 1 tablet by mouth every 12 (twelve) hours for 7 days. 02/18/23 02/25/23 Yes Bernardino Ditch, NP  benzonatate  (TESSALON ) 100 MG capsule Take 2 capsules (200 mg total) by mouth every 8 (eight) hours. 02/18/23  Yes Bernardino Ditch, NP  ipratropium (ATROVENT ) 0.06 % nasal spray Place 2 sprays into both nostrils 4 (four) times daily. 02/18/23  Yes Bernardino Ditch, NP  promethazine -dextromethorphan (PROMETHAZINE -DM) 6.25-15 MG/5ML syrup Take 5 mLs by mouth 4 (four) times daily as needed. 02/18/23  Yes Bernardino Ditch, NP  albuterol  (PROVENTIL  HFA;VENTOLIN  HFA) 108 (90 Base) MCG/ACT inhaler Inhale 2 puffs into the lungs every 4 (four) hours as needed for wheezing. 10/17/17   Cleotilde Jacobsen, NP  famotidine  (PEPCID ) 20 MG tablet Take 1 tablet (20 mg total) by mouth 2 (two) times daily. 09/15/22   Bernardino Ditch, NP  fexofenadine  (ALLEGRA ) 180 MG tablet Take 1 tablet (180 mg total) by mouth daily. 09/15/22   Bernardino Ditch, NP  FLUoxetine (PROZAC) 40 MG capsule Take 40 mg by mouth daily.    [provider]  fluticasone  (FLONASE ) 50 MCG/ACT nasal spray Place 2 sprays into both nostrils daily.  02/04/16   Desiderio Beagle, MD  linaclotide LARUE) 145 MCG CAPS capsule Take 145 mcg by mouth daily before supper. Patient not taking: Reported on 09/10/2020    [provider]  omeprazole (PRILOSEC) 40 MG capsule Take 40 mg by mouth daily.   Yes [provider]    Family History Family History  Problem Relation Age of Onset   Heart disease Father    Breast cancer Neg Hx     Social History Social History   Tobacco Use   Smoking status: Every Day    Current packs/day: 1.00    Average  packs/day: 1 pack/day for 20.0 years (20.0 ttl pk-yrs)    Types: Cigarettes   Smokeless tobacco: Never  Vaping Use   Vaping status: Never Used  Substance Use Topics   Alcohol use: No   Drug use: No     Allergies   Pantoprazole   Review of Systems Review of Systems  Constitutional:  Positive for fever.  HENT:  Positive for congestion, ear pain, rhinorrhea and sore throat.   Respiratory:  Positive for cough and wheezing. Negative for shortness of breath.      Physical Exam Triage Vital Signs ED Triage Vitals  Encounter Vitals Group     BP      Systolic BP Percentile      Diastolic BP Percentile      Pulse      Resp      Temp      Temp src      SpO2      Weight      Height      Head Circumference      Peak Flow      Pain Score      Pain Loc      Pain Education      Exclude from Growth Chart    No data found.  Updated Vital Signs BP 134/84 (BP Location: Left Arm)   Pulse 89   Temp 98.5 F (36.9 C) (Oral)   Resp 14   Ht 5' 4 (1.626 m)   Wt 175 lb 0.7 oz (79.4 kg)   SpO2 98%   BMI 30.05 kg/m   Visual Acuity Right Eye Distance:   Left Eye Distance:   Bilateral Distance:    Right Eye Near:   Left Eye Near:    Bilateral Near:     Physical Exam Vitals and nursing note reviewed.  Constitutional:      Appearance: Normal appearance. She is not ill-appearing.  HENT:     Head: Normocephalic and atraumatic.     Right Ear: Tympanic membrane, ear canal and external ear normal. There is no impacted cerumen.     Left Ear: Tympanic membrane, ear canal and external ear normal. There is no impacted cerumen.     Nose: Congestion and rhinorrhea present.     Comments: Nasal mucosa is erythematous and edematous with scant yellow discharge in both nares.    Mouth/Throat:     Mouth: Mucous membranes are moist.     Pharynx: Oropharynx is clear. Posterior oropharyngeal erythema present. No oropharyngeal exudate.     Comments: Tonsillar pillars are erythematous and  edematous but free of exudate. Cardiovascular:     Rate and Rhythm: Normal rate and regular rhythm.     Pulses: Normal pulses.     Heart sounds: Normal heart sounds. No murmur heard.    No friction rub. No gallop.  Pulmonary:     Effort:  Pulmonary effort is normal.     Breath sounds: Normal breath sounds. No wheezing, rhonchi or rales.  Musculoskeletal:     Cervical back: Normal range of motion and neck supple. No tenderness.  Lymphadenopathy:     Cervical: No cervical adenopathy.  Skin:    General: Skin is warm and dry.     Capillary Refill: Capillary refill takes less than 2 seconds.     Findings: No erythema or rash.  Neurological:     General: No focal deficit present.     Mental Status: She is alert and oriented to person, place, and time.      UC Treatments / Results  Labs (all labs ordered are listed, but only abnormal results are displayed) Labs Reviewed  GROUP A STREP BY PCR    EKG   Radiology No results found.  Procedures Procedures (including critical care time)  Medications Ordered in UC Medications - No data to display  Initial Impression / Assessment and Plan / UC Course  I have reviewed the triage vital signs and the nursing notes.  Pertinent labs & imaging results that were available during my care of the patient were reviewed by me and considered in my medical decision making (see chart for details).   Patient is a nontoxic-appearing 53 year old female returning for evaluation 2 weeks with respiratory symptoms as outlined HPI above.  One of her most prominent symptoms is an off-and-on sore throat that has become more prominent over the last 3 days.  She reports a subjective fever but has not measured it at home.  Her cough is nonproductive.  The patient is reporting a fever but has not measured it at home.  Currently she is afebrile with an oral temp of 98.5.  She is able to speak in full sentence with dyspnea or tachypnea and her respiratory rate at  triage was 14 with a 98% room her oxygen  saturation.  Her physical exam does reveal inflammation of her upper respiratory tract with yellow nasal discharge.  Oropharyngeal exam reveals edematous and erythematous tonsillar pillars but no appreciable exudate.  Cardiopulmonary exam is benign.  Given that patient has had symptoms for 2 weeks I will order a strep PCR.  She is outside therapeutic window for antivirals for COVID influenza so I will not test for that at this time.  Her cough has been nonproductive the entire time so I do not feel a chest x-ray is warranted at this time.  Strep test is negative.  I will discharge patient home with a diagnosis of URI with cough and congestion.  Considering her symptoms have been present for over 2 weeks a trial of antibiotics is warranted.  I will discharge her home on Augmentin  875 mg twice daily with food for 7 days for treatment of URI.  Atrovent  nasal spray to help with the nasal congestion.  Tessalon  Perles and Promethazine  DM cough syrup for cough and congestion.  Tylenol  and ibuprofen as needed for fever or pain.   Final Clinical Impressions(s) / UC Diagnoses   Final diagnoses:  URI with cough and congestion     Discharge Instructions      Your test for strep today was negative.  Your exam is consistent with an upper respiratory infection and given that she been experiencing symptoms for 2 weeks a trial of antibiotics is warranted.  Take the Augmentin  875 mg twice daily with food for 7 days for treatment of your upper respiratory infection.  Use over-the-counter Tylenol  and/or ibuprofen  according the package instructions as needed for any fever or pain.  Use the Atrovent  nasal spray, 2 squirts in each nostril every 6 hours, as needed for runny nose and postnasal drip.  Use the Tessalon  Perles every 8 hours during the day.  Take them with a small sip of water.  They may give you some numbness to the base of your tongue or a metallic taste in  your mouth, this is normal.  Use the Promethazine  DM cough syrup at bedtime for cough and congestion.  It will make you drowsy so do not take it during the day.  Return for reevaluation or see your primary care provider for any new or worsening symptoms.      ED Prescriptions     Medication Sig Dispense Auth. Provider   amoxicillin -clavulanate (AUGMENTIN ) 875-125 MG tablet Take 1 tablet by mouth every 12 (twelve) hours for 7 days. 14 tablet Bernardino Ditch, NP   benzonatate  (TESSALON ) 100 MG capsule Take 2 capsules (200 mg total) by mouth every 8 (eight) hours. 21 capsule Bernardino Ditch, NP   ipratropium (ATROVENT ) 0.06 % nasal spray Place 2 sprays into both nostrils 4 (four) times daily. 15 mL Bernardino Ditch, NP   promethazine -dextromethorphan (PROMETHAZINE -DM) 6.25-15 MG/5ML syrup Take 5 mLs by mouth 4 (four) times daily as needed. 118 mL Bernardino Ditch, NP      PDMP not reviewed this encounter.   Bernardino Ditch, NP 02/18/23 1010

## 2023-02-18 NOTE — ED Triage Notes (Signed)
 Patient reports cough for 2 weeks.  Patient reports sore throat that started on Thursday.  Patient unsure of fevers.

## 2023-02-18 NOTE — Discharge Instructions (Addendum)
 Your test for strep today was negative.  Your exam is consistent with an upper respiratory infection and given that she been experiencing symptoms for 2 weeks a trial of antibiotics is warranted.  Take the Augmentin  875 mg twice daily with food for 7 days for treatment of your upper respiratory infection.  Use over-the-counter Tylenol  and/or ibuprofen according the package instructions as needed for any fever or pain.  Use the Atrovent  nasal spray, 2 squirts in each nostril every 6 hours, as needed for runny nose and postnasal drip.  Use the Tessalon  Perles every 8 hours during the day.  Take them with a small sip of water.  They may give you some numbness to the base of your tongue or a metallic taste in your mouth, this is normal.  Use the Promethazine  DM cough syrup at bedtime for cough and congestion.  It will make you drowsy so do not take it during the day.  Return for reevaluation or see your primary care provider for any new or worsening symptoms.

## 2023-07-05 ENCOUNTER — Ambulatory Visit: Admission: EM | Admit: 2023-07-05 | Discharge: 2023-07-05 | Disposition: A | Discharge status: Home / Self Care

## 2023-07-05 DIAGNOSIS — N3001 Acute cystitis with hematuria: Secondary | ICD-10-CM

## 2023-07-05 LAB — URINALYSIS, W/ REFLEX TO CULTURE (INFECTION SUSPECTED)
Bilirubin Urine: NEGATIVE
Glucose, UA: NEGATIVE mg/dL
Ketones, ur: NEGATIVE mg/dL
Leukocytes,Ua: NEGATIVE
Nitrite: NEGATIVE
Protein, ur: NEGATIVE mg/dL
Specific Gravity, Urine: 1.01 (ref 1.005–1.030)
pH: 5.5 (ref 5.0–8.0)

## 2023-07-05 MED ORDER — PHENAZOPYRIDINE HCL 200 MG PO TABS: 200.0000 mg | ORAL_TABLET | Freq: Three times a day (TID) | ORAL | 0 refills | Status: AC

## 2023-07-05 MED ORDER — CEPHALEXIN 500 MG PO CAPS: 500.0000 mg | ORAL_CAPSULE | Freq: Three times a day (TID) | ORAL | 0 refills | Status: AC

## 2023-07-05 NOTE — Discharge Instructions (Signed)
 1. Acute cystitis with hematuria (Primary) - Urinalysis completed in UC shows few bacteria, trace blood, no leukocytes, no nitrite, these findings are possibly indicative of urinary tract infection.  Empiric treatment will be initiated and culture sent. - cephALEXin  (KEFLEX ) 500 MG capsule; Take 1 capsule (500 mg total) by mouth 3 (three) times daily for 5 days.  Dispense: 15 capsule; Refill: 0 - phenazopyridine  (PYRIDIUM ) 200 MG tablet; Take 1 tablet (200 mg total) by mouth 3 (three) times daily.  Dispense: 6 tablet; Refill: 0 - Urine Culture collected in UC and sent to lab for further testing results should be available in 2 to 3 days

## 2023-07-05 NOTE — ED Provider Notes (Signed)
 [facility]  Note:  This document was prepared using Conservation officer, historic buildings and may include unintentional dictation errors.  MRN: [id number] DOB: 1970-09-22  Subjective:   Patricia Hoffman is a 53 y.o. female presenting for bilateral lower back pain and increased urinary frequency x 1 week.  Patient reports increased pain when laying down, trouble sleeping.  Patient denies any dysuria, suprapubic abdominal pressure or flank pain.  Reports taking ibuprofen but denies any change in symptoms.  Patient reports past history of urinary tract infection but not for several months.  Patient reports that she has used Azo in the past but does not feel that she ever gets much relief from taking this medication.  No current facility-administered medications for this encounter.  Current Outpatient Medications:    albuterol  (PROVENTIL  HFA;VENTOLIN  HFA) 108 (90 Base) MCG/ACT inhaler, Inhale 2 puffs into the lungs every 4 (four) hours as needed for wheezing., Disp: 1 Inhaler, Rfl: 0   cephALEXin  (KEFLEX ) 500 MG capsule, Take 1 capsule (500 mg total) by mouth 3 (three) times daily for 5 days., Disp: 15 capsule, Rfl: 0   famotidine  (PEPCID ) 20 MG tablet, Take 1 tablet (20 mg total) by mouth 2 (two) times daily., Disp: 30 tablet, Rfl: 0   fexofenadine  (ALLEGRA ) 180 MG tablet, Take 1 tablet (180 mg total) by mouth daily., Disp: 30 tablet, Rfl: 0   FLUoxetine (PROZAC) 40 MG capsule, Take 40 mg by mouth daily., Disp: , Rfl:    fluticasone  (FLONASE ) 50 MCG/ACT nasal spray, Place 2 sprays into both nostrils daily., Disp: 16 g, Rfl: 0   ipratropium (ATROVENT ) 0.06 % nasal spray, Place 2 sprays into both nostrils 4 (four) times daily., Disp: 15 mL, Rfl: 12   omeprazole (PRILOSEC) 40 MG capsule, Take 40 mg by mouth daily., Disp: , Rfl:    phenazopyridine (PYRIDIUM) 200 MG tablet, Take 1 tablet (200 mg total) by mouth 3 (three) times daily., Disp: 6 tablet, Rfl: 0   benzonatate  (TESSALON ) 100 MG  capsule, Take 2 capsules (200 mg total) by mouth every 8 (eight) hours., Disp: 21 capsule, Rfl: 0   linaclotide (LINZESS) 145 MCG CAPS capsule, Take 145 mcg by mouth daily before supper. (Patient not taking: Reported on 09/10/2020), Disp: , Rfl:    promethazine -dextromethorphan (PROMETHAZINE -DM) 6.25-15 MG/5ML syrup, Take 5 mLs by mouth 4 (four) times daily as needed., Disp: 118 mL, Rfl: 0   Allergies  Allergen Reactions   Pantoprazole Diarrhea    Past Medical History:  Diagnosis Date   Cancer [facility])    Patient denies having breast cancer.   Depression    GERD (gastroesophageal reflux disease)    Headache    Hiatal hernia    IBS (irritable bowel syndrome)      Past Surgical History:  Procedure Laterality Date   ABDOMINAL HYSTERECTOMY     BREAST BIOPSY Right    both benign ?years per pt   BREAST CYST ASPIRATION Left 2016   left fna-neg   CHOLECYSTECTOMY     COLONOSCOPY WITH PROPOFOL  N/A 09/10/2020   Procedure: COLONOSCOPY WITH PROPOFOL ;  Surgeon: Shane Darling, MD;  Location: [facility];  Service: Endoscopy;  Laterality: N/A;   ESOPHAGOGASTRODUODENOSCOPY (EGD) WITH PROPOFOL  N/A 09/10/2020   Procedure: ESOPHAGOGASTRODUODENOSCOPY (EGD) WITH PROPOFOL ;  Surgeon: Shane Darling, MD;  Location: [facility] ENDOSCOPY;  Service: Endoscopy;  Laterality: N/A;   NERVE REPAIR Left 12/13/2018   Procedure: BAXTER'S RELEASE;  Surgeon: Anell Baptist, DPM;  Location: [facility] ORS;  Service: Podiatry;  Laterality:  Left;   PLANTAR FASCIA RELEASE Left 12/13/2018   Procedure: ENDOSCOPIC PLANTAR FASCIA RELEASE;  Surgeon: Anell Baptist, DPM;  Location: [facility] ORS;  Service: Podiatry;  Laterality: Left;   TONSILLECTOMY      Family History  Problem Relation Age of Onset   Heart disease Father    Breast cancer Neg Hx     Social History   Tobacco Use   Smoking status: Every Day    Current packs/day: 1.00    Average packs/day: 1 pack/day for 20.0 years (20.0 ttl pk-yrs)    Types: Cigarettes    Smokeless tobacco: Never  Vaping Use   Vaping status: Never Used  Substance Use Topics   Alcohol use: No   Drug use: No    ROS Refer to HPI for ROS details.  Objective:   Vitals: BP 118/85 (BP Location: Left Arm)   Pulse 74   Temp 97.7 F (36.5 C) (Oral)   Ht 5\' 6"  (1.676 m)   Wt 170 lb (77.1 kg)   SpO2 98%   BMI 27.44 kg/m   Physical Exam Vitals and nursing note reviewed.  Constitutional:      General: She is not in acute distress.    Appearance: Normal appearance. She is well-developed. She is not ill-appearing or toxic-appearing.  HENT:     Head: Normocephalic and atraumatic.     Mouth/Throat:     Mouth: Mucous membranes are moist.  Cardiovascular:     Rate and Rhythm: Normal rate.  Pulmonary:     Effort: Pulmonary effort is normal. No respiratory distress.  Abdominal:     General: There is no distension.     Palpations: Abdomen is soft.     Tenderness: There is no abdominal tenderness. There is no right CVA tenderness, left CVA tenderness, guarding or rebound.  Musculoskeletal:     Lumbar back: Tenderness present. No swelling, spasms or bony tenderness. Normal range of motion.  Skin:    General: Skin is warm and dry.  Neurological:     General: No focal deficit present.     Mental Status: She is alert and oriented to person, place, and time.  Psychiatric:        Mood and Affect: Mood normal.        Behavior: Behavior normal.     Procedures  Results for orders placed or performed during the hospital encounter of 07/05/23 (from the past 24 hours)  Urinalysis, w/ Reflex to Culture (Infection Suspected) -Urine, Clean Catch     Status: Abnormal   Collection Time: 07/05/23  8:38 AM  Result Value Ref Range   Specimen Source URINE, CLEAN CATCH    Color, Urine YELLOW YELLOW   APPearance CLEAR CLEAR   Specific Gravity, Urine 1.010 1.005 - 1.030   pH 5.5 5.0 - 8.0   Glucose, UA NEGATIVE NEGATIVE mg/dL   Hgb urine dipstick TRACE (A) NEGATIVE   Bilirubin  Urine NEGATIVE NEGATIVE   Ketones, ur NEGATIVE NEGATIVE mg/dL   Protein, ur NEGATIVE NEGATIVE mg/dL   Nitrite NEGATIVE NEGATIVE   Leukocytes,Ua NEGATIVE NEGATIVE   Squamous Epithelial / HPF 0-5 0 - 5 /HPF   WBC, UA 0-5 0 - 5 WBC/hpf   RBC / HPF 6-10 0 - 5 RBC/hpf   Bacteria, UA FEW (A) NONE SEEN   Mucus PRESENT    Budding Yeast PRESENT     Assessment and Plan :     Discharge Instructions      1. Acute cystitis with hematuria (Primary) -  Urinalysis completed in UC shows few bacteria, trace blood, no leukocytes, no nitrite, these findings are possibly indicative of urinary tract infection.  Empiric treatment will be initiated and culture sent. - cephALEXin  (KEFLEX ) 500 MG capsule; Take 1 capsule (500 mg total) by mouth 3 (three) times daily for 5 days.  Dispense: 15 capsule; Refill: 0 - phenazopyridine (PYRIDIUM) 200 MG tablet; Take 1 tablet (200 mg total) by mouth 3 (three) times daily.  Dispense: 6 tablet; Refill: 0 - Urine Culture collected in UC and sent to lab for further testing results should be available in 2 to 3 days    Wafaa Deemer B Apryl Brymer   Lailee Hoelzel, [place] B, NP 07/05/23 [phone]

## 2023-07-05 NOTE — ED Triage Notes (Signed)
 Pt c/o lower back pain x1week  Pt states that she has had urinary frequency, trouble sleeping, and pain when laying down.   Pt has been taking Ibuprofen for the pain but states that she has IBS and can not take it too often.

## 2023-07-06 LAB — URINE CULTURE
Culture: <10000 — AB
Special Requests: NORMAL

## 2023-07-09 ENCOUNTER — Ambulatory Visit
Admission: EM | Admit: 2023-07-09 | Discharge: 2023-07-09 | Disposition: A | Discharge status: Home / Self Care | Attending: Physician Assistant | Admitting: Physician Assistant

## 2023-07-09 ENCOUNTER — Ambulatory Visit (HOSPITAL_COMMUNITY): Payer: Self-pay

## 2023-07-09 DIAGNOSIS — T887XXA Unspecified adverse effect of drug or medicament, initial encounter: Secondary | ICD-10-CM

## 2023-07-09 DIAGNOSIS — R197 Diarrhea, unspecified: Secondary | ICD-10-CM | POA: Diagnosis not present

## 2023-07-09 DIAGNOSIS — R110 Nausea: Secondary | ICD-10-CM

## 2023-07-09 MED ORDER — ONDANSETRON 4 MG PO TBDP: 4.0000 mg | ORAL_TABLET | Freq: Three times a day (TID) | ORAL | 0 refills | Status: AC | PRN

## 2023-07-09 NOTE — ED Provider Notes (Signed)
 [facility]    CSN: [id number] Arrival date & time: 07/09/23  4098      History   Chief Complaint Chief Complaint  Patient presents with   Medication Reaction   Diarrhea    HPI Patricia Hoffman is a 53 y.o. female presenting for 4-day history of nausea and diarrhea.  Patient was seen and evaluated here for urinary symptoms on 07/05/2023.  She was diagnosed with a urinary tract infection based on urinalysis which showed trace hemoglobin.  Urine culture was negative.  Patient has been on Keflex  for suspected UTI. The last dose of the medicine was yesterday morning.  She says she has continued to have nausea and diarrhea (3-4 times per day). Imodium helps but than causes her to "get backed up." Reports continued urinary frequency, but denies fever, flank pain, dysuria, difficulty urinating, hematuria, vaginal discharge.  Has continued to take Pyridium  and states her urine is orange.    HPI  Past Medical History:  Diagnosis Date   Cancer [facility])    Patient denies having breast cancer.   Depression    GERD (gastroesophageal reflux disease)    Headache    Hiatal hernia    IBS (irritable bowel syndrome)     Patient Active Problem List   Diagnosis Date Noted   Acute upper respiratory infection 07/18/2022   Smoker 07/18/2022    Past Surgical History:  Procedure Laterality Date   ABDOMINAL HYSTERECTOMY     BREAST BIOPSY Right    both benign ?years per pt   BREAST CYST ASPIRATION Left 2016   left fna-neg   CHOLECYSTECTOMY     COLONOSCOPY WITH PROPOFOL  N/A 09/10/2020   Procedure: COLONOSCOPY WITH PROPOFOL ;  Surgeon: Shane Darling, MD;  Location: [facility];  Service: Endoscopy;  Laterality: N/A;   ESOPHAGOGASTRODUODENOSCOPY (EGD) WITH PROPOFOL  N/A 09/10/2020   Procedure: ESOPHAGOGASTRODUODENOSCOPY (EGD) WITH PROPOFOL ;  Surgeon: Shane Darling, MD;  Location: [facility];  Service: Endoscopy;  Laterality: N/A;   NERVE REPAIR Left 12/13/2018   Procedure:  BAXTER'S RELEASE;  Surgeon: Anell Baptist, DPM;  Location: [facility];  Service: Podiatry;  Laterality: Left;   PLANTAR FASCIA RELEASE Left 12/13/2018   Procedure: ENDOSCOPIC PLANTAR FASCIA RELEASE;  Surgeon: Anell Baptist, DPM;  Location: [facility];  Service: Podiatry;  Laterality: Left;   TONSILLECTOMY      OB History   No obstetric history on file.      Home Medications    Prior to Admission medications   Medication Sig Start Date End Date Taking? Authorizing Provider  cephALEXin  (KEFLEX ) 500 MG capsule Take 1 capsule (500 mg total) by mouth 3 (three) times daily for 5 days. 07/05/23 07/10/23 Yes Reddick, Johnathan B, NP  ondansetron  (ZOFRAN -ODT) 4 MG disintegrating tablet Take 1 tablet (4 mg total) by mouth every 8 (eight) hours as needed. 07/09/23  Yes Floydene Hy, PA-C  phenazopyridine  (PYRIDIUM ) 200 MG tablet Take 1 tablet (200 mg total) by mouth 3 (three) times daily. 07/05/23  Yes Reddick, Johnathan B, NP  albuterol  (PROVENTIL  HFA;VENTOLIN  HFA) 108 (90 Base) MCG/ACT inhaler Inhale 2 puffs into the lungs every 4 (four) hours as needed for wheezing. 10/17/17   Janett Medin, NP  benzonatate  (TESSALON ) 100 MG capsule Take 2 capsules (200 mg total) by mouth every 8 (eight) hours. 02/18/23   Kent Pear, NP  famotidine  (PEPCID ) 20 MG tablet Take 1 tablet (20 mg total) by mouth 2 (two) times daily. 09/15/22   Kent Pear, NP  fexofenadine  (ALLEGRA ) 180  MG tablet Take 1 tablet (180 mg total) by mouth daily. 09/15/22   Kent Pear, NP  FLUoxetine (PROZAC) 40 MG capsule Take 40 mg by mouth daily.    [provider]  fluticasone  (FLONASE ) 50 MCG/ACT nasal spray Place 2 sprays into both nostrils daily. 02/04/16   Sharren Decree, MD  ipratropium (ATROVENT ) 0.06 % nasal spray Place 2 sprays into both nostrils 4 (four) times daily. 02/18/23   Kent Pear, NP  linaclotide [facility]) 145 MCG CAPS capsule Take 145 mcg by mouth daily before supper. Patient not taking: Reported on 09/10/2020     [provider]  omeprazole (PRILOSEC) 40 MG capsule Take 40 mg by mouth daily.    [provider]  promethazine -dextromethorphan (PROMETHAZINE -DM) 6.25-15 MG/5ML syrup Take 5 mLs by mouth 4 (four) times daily as needed. 02/18/23   Kent Pear, NP    Family History Family History  Problem Relation Age of Onset   Heart disease Father    Breast cancer Neg Hx     Social History Social History   Tobacco Use   Smoking status: Every Day    Current packs/day: 1.00    Average packs/day: 1 pack/day for 20.0 years (20.0 ttl pk-yrs)    Types: Cigarettes   Smokeless tobacco: Never  Vaping Use   Vaping status: Never Used  Substance Use Topics   Alcohol use: No   Drug use: No     Allergies   Pantoprazole   Review of Systems Review of Systems  Constitutional:  Positive for chills. Negative for fatigue and fever.  Gastrointestinal:  Positive for abdominal pain, diarrhea and nausea. Negative for blood in stool and vomiting.  Genitourinary:  Positive for frequency. Negative for decreased urine volume, dysuria, flank pain, hematuria, pelvic pain, urgency, vaginal bleeding, vaginal discharge and vaginal pain.  Musculoskeletal:  Negative for back pain.  Skin:  Negative for rash.     Physical Exam Triage Vital Signs ED Triage Vitals  Encounter Vitals Group     BP      Systolic BP Percentile      Diastolic BP Percentile      Pulse      Resp      Temp      Temp src      SpO2      Weight      Height      Head Circumference      Peak Flow      Pain Score      Pain Loc      Pain Education      Exclude from Growth Chart    No data found.  Updated Vital Signs BP 124/82 (BP Location: Right Arm)   Pulse 75   Temp 98 F (36.7 C) (Oral)   Resp 16   Ht 5\' 6"  (1.676 m)   Wt 170 lb (77.1 kg)   SpO2 95%   BMI 27.44 kg/m       Physical Exam Vitals and nursing note reviewed.  Constitutional:      General: She is not in acute distress.    Appearance:  Normal appearance. She is not ill-appearing or toxic-appearing.  HENT:     Head: Normocephalic and atraumatic.  Eyes:     General: No scleral icterus.       Right eye: No discharge.        Left eye: No discharge.     Conjunctiva/sclera: Conjunctivae normal.  Cardiovascular:     Rate and Rhythm:  Normal rate and regular rhythm.     Heart sounds: Normal heart sounds.  Pulmonary:     Effort: Pulmonary effort is normal. No respiratory distress.     Breath sounds: Normal breath sounds.  Abdominal:     Palpations: Abdomen is soft.     Tenderness: There is abdominal tenderness (generalized throughout lower abdomen). There is no right CVA tenderness or left CVA tenderness.  Musculoskeletal:     Cervical back: Neck supple.  Skin:    General: Skin is dry.  Neurological:     General: No focal deficit present.     Mental Status: She is alert. Mental status is at baseline.     Motor: No weakness.     Gait: Gait normal.  Psychiatric:        Mood and Affect: Mood normal.        Behavior: Behavior normal.      UC Treatments / Results  Labs (all labs ordered are listed, but only abnormal results are displayed) Labs Reviewed - No data to display  EKG   Radiology No results found.  Procedures Procedures (including critical care time)  Medications Ordered in UC Medications - No data to display  Initial Impression / Assessment and Plan / UC Course  I have reviewed the triage vital signs and the nursing notes.  Pertinent labs & imaging results that were available during my care of the patient were reviewed by me and considered in my medical decision making (see chart for details).   53 year old female presents for nausea and diarrhea for the past 4 days.  Patient has been on Keflex  for suspected UTI based on a visit from 07/05/2023.  Urine culture was negative.  Vitals are stable and normal and she is overall well-appearing.  She has mild tenderness throughout the lower abdomen but  no guarding or rebound.  No CVA tenderness.  Chest clear.  Heart regular rate rhythm.  Offered to recheck her urine but explained that her urine culture was negative from a few days ago and she did not appear to have a UTI at that time.  However, since she has had some diarrhea recently, we can check to make sure she has not picked up a infection.  Patient declines.  She says she will return if symptoms worsen.  Symptoms likely related to antibiotic medication side effect.  Advised to discontinue Keflex  and return if urinary symptoms worsen.  Advised to return if diarrhea continues after the next 3 days or so or worsens, she develops a fever, worsening abdominal pain.  Sent Zofran  for nausea.  Supportive care.  Work note given.   Final Clinical Impressions(s) / UC Diagnoses   Final diagnoses:  Diarrhea, unspecified type  Nausea  Medication side effect     Discharge Instructions      ABDOMINAL PAIN: You may take Tylenol  for pain relief. Use medications as directed including antiemetics and antidiarrheal medications if suggested or prescribed. You should increase fluids and electrolytes as well as rest over these next several days. If you have any questions or concerns, or if your symptoms are not improving or if especially if they acutely worsen, please call or stop back to the clinic immediately and we will be happy to help you or go to the ER   ABDOMINAL PAIN RED FLAGS: Seek immediate further care if: symptoms remain the same or worsen over the next 3-7 days, you are unable to keep fluids down, you see blood or mucus in your  stool, you vomit black or dark red material, you have a fever of 101.F or higher, you have localized and/or persistent abdominal pain    If you have painful urination or frequency is not improving, develop vaginal discharge or increased itching please return for another evaluation.   ED Prescriptions     Medication Sig Dispense Auth. Provider   ondansetron   (ZOFRAN -ODT) 4 MG disintegrating tablet Take 1 tablet (4 mg total) by mouth every 8 (eight) hours as needed. 15 tablet Alegria Dominique B, PA-C      PDMP not reviewed this encounter.   Floydene Hy, PA-C 07/09/23 [phone]

## 2023-07-09 NOTE — Discharge Instructions (Signed)
 ABDOMINAL PAIN: You may take Tylenol  for pain relief. Use medications as directed including antiemetics and antidiarrheal medications if suggested or prescribed. You should increase fluids and electrolytes as well as rest over these next several days. If you have any questions or concerns, or if your symptoms are not improving or if especially if they acutely worsen, please call or stop back to the clinic immediately and we will be happy to help you or go to the ER   ABDOMINAL PAIN RED FLAGS: Seek immediate further care if: symptoms remain the same or worsen over the next 3-7 days, you are unable to keep fluids down, you see blood or mucus in your stool, you vomit black or dark red material, you have a fever of 101.F or higher, you have localized and/or persistent abdominal pain    If you have painful urination or frequency is not improving, develop vaginal discharge or increased itching please return for another evaluation.

## 2023-07-09 NOTE — ED Triage Notes (Signed)
 Pt c/o diarrhea & nausea x4 days. Currently on cephalexin  d/t UTI. Has tried immodium w/o relief.
